# Patient Record
Sex: Male | Born: 1985 | Race: White | Hispanic: No | Marital: Married | Smoking: Never smoker
Health system: Southern US, Community
[De-identification: ages and names within clinical notes are randomized; demographics above are authoritative.]

## PROBLEM LIST (undated history)

## (undated) HISTORY — PX: APPENDECTOMY: SHX54

---
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
## 2008-09-10 ENCOUNTER — Observation Stay (HOSPITAL_COMMUNITY): Admission: EM | Admit: 2008-09-10 | Discharge: 2008-09-10 | Payer: Self-pay | Admitting: Emergency Medicine

## 2008-09-10 ENCOUNTER — Encounter (INDEPENDENT_AMBULATORY_CARE_PROVIDER_SITE_OTHER): Payer: Self-pay | Admitting: *Deleted

## 2010-11-30 LAB — URINALYSIS, ROUTINE W REFLEX MICROSCOPIC
Glucose, UA: NEGATIVE mg/dL
Ketones, ur: 15 mg/dL — AB
Leukocytes, UA: NEGATIVE
Nitrite: NEGATIVE
Protein, ur: NEGATIVE mg/dL

## 2010-11-30 LAB — DIFFERENTIAL
Basophils Absolute: 0 10*3/uL (ref 0.0–0.1)
Lymphocytes Relative: 5 % — ABNORMAL LOW (ref 12–46)
Neutro Abs: 13.3 10*3/uL — ABNORMAL HIGH (ref 1.7–7.7)
Neutrophils Relative %: 89 % — ABNORMAL HIGH (ref 43–77)

## 2010-11-30 LAB — COMPREHENSIVE METABOLIC PANEL
Albumin: 4 g/dL (ref 3.5–5.2)
BUN: 11 mg/dL (ref 6–23)
CO2: 25 mEq/L (ref 19–32)
Chloride: 103 mEq/L (ref 96–112)
Creatinine, Ser: 0.99 mg/dL (ref 0.4–1.5)
GFR calc non Af Amer: >60 mL/min (ref >60)
Glucose, Bld: 115 mg/dL — ABNORMAL HIGH (ref 70–99)
Total Bilirubin: 1 mg/dL (ref 0.3–1.2)

## 2010-11-30 LAB — LIPASE, BLOOD: Lipase: 27 U/L (ref 11–59)

## 2010-11-30 LAB — CBC
HCT: 37.9 % — ABNORMAL LOW (ref 39.0–52.0)
MCV: 86.7 fL (ref 78.0–100.0)
Platelets: 190 10*3/uL (ref 150–400)
WBC: 14.9 10*3/uL — ABNORMAL HIGH (ref 4.0–10.5)

## 2010-12-29 NOTE — Op Note (Signed)
Brian Blake, Brian Blake                ACCOUNT NO.:  [id number]   MEDICAL RECORD NO.:  [id number]          PATIENT TYPE:  INP   LOCATION:  5501                         FACILITY:  [facility]   PHYSICIAN:  Alfonse Ras, MD   DATE OF BIRTH:  06/06/1986   DATE OF PROCEDURE:  09/10/2008  DATE OF DISCHARGE:                               OPERATIVE REPORT   PREOPERATIVE DIAGNOSIS:  Acute appendicitis.   POSTOPERATIVE DIAGNOSIS:  Acute appendicitis.   PROCEDURE:  Laparoscopic appendectomy.   SURGEON:  Alfonse Ras, MD   ASSISTANT:  Letha Cape, PA   ANESTHESIA:  General.   DESCRIPTION OF PROCEDURE:  The patient was taken to the operating room  after informed consent was granted and placed in the supine position.  Foley catheter was placed.  Abdomen was prepped and draped in a normal  sterile fashion.  Using a 12-mm OptiVu trocar in the left upper  quadrant, peritoneal access was obtained under direct vision.  Pneumoperitoneum was obtained.  Additional 12-mm trocar was placed under  direct vision in the left lower quadrant and 5-mm trocar was placed in a  similar fashion.  The appendix which was somewhat inflamed but  suppurative with no evidence of perforation was mobilized anteriorly.  The mesoappendix was taken down with a harmonic scalpel.  The junction  of the base of the appendix and the cecum were identified and were  transected using a white load 45-mm GIA stapling device.  Staple line  was intact.  Adequate hemostasis was ensured.  Right lower quadrant was  copiously irrigated.  The appendix was placed in EndoCatch bag and  removed through the left lower quadrant port.  The fascial defect tear  was closed with a figure-of-eight 0-Vicryl suture.  Skin incisions were  closed with subcuticular 4-0 Monocryl.  Incisions were injected with 0.5  Marcaine.  The patient tolerated the procedure well.  Sterile dressings  were applied and he was taken to the PACU in good  condition.      Alfonse Ras, MD  Electronically Signed     KRE/MEDQ  D:  09/10/2008  T:  09/10/2008  Job:  [id number]

## 2011-07-30 ENCOUNTER — Ambulatory Visit (INDEPENDENT_AMBULATORY_CARE_PROVIDER_SITE_OTHER): Payer: 59

## 2011-07-30 DIAGNOSIS — J019 Acute sinusitis, unspecified: Secondary | ICD-10-CM

## 2011-07-30 DIAGNOSIS — R059 Cough, unspecified: Secondary | ICD-10-CM

## 2011-07-30 DIAGNOSIS — R05 Cough: Secondary | ICD-10-CM

## 2011-08-03 ENCOUNTER — Ambulatory Visit: Payer: Worker's Compensation

## 2011-08-09 ENCOUNTER — Ambulatory Visit: Payer: Worker's Compensation

## 2011-11-10 ENCOUNTER — Telehealth: Payer: Self-pay

## 2011-12-02 NOTE — Telephone Encounter (Signed)
test

## 2011-12-03 ENCOUNTER — Ambulatory Visit (INDEPENDENT_AMBULATORY_CARE_PROVIDER_SITE_OTHER): Payer: 59 | Admitting: Emergency Medicine

## 2011-12-03 ENCOUNTER — Ambulatory Visit: Payer: 59

## 2011-12-03 VITALS — BP 126/68 | HR 56 | Temp 97.7°F | Resp 18 | Ht 70.0 in | Wt 148.4 lb

## 2011-12-03 DIAGNOSIS — S81009A Unspecified open wound, unspecified knee, initial encounter: Secondary | ICD-10-CM

## 2011-12-03 DIAGNOSIS — T148XXA Other injury of unspecified body region, initial encounter: Secondary | ICD-10-CM

## 2011-12-03 DIAGNOSIS — L03119 Cellulitis of unspecified part of limb: Secondary | ICD-10-CM

## 2011-12-03 DIAGNOSIS — L089 Local infection of the skin and subcutaneous tissue, unspecified: Secondary | ICD-10-CM

## 2011-12-03 DIAGNOSIS — L02419 Cutaneous abscess of limb, unspecified: Secondary | ICD-10-CM

## 2011-12-03 DIAGNOSIS — S81809A Unspecified open wound, unspecified lower leg, initial encounter: Secondary | ICD-10-CM

## 2011-12-03 MED ORDER — CEPHALEXIN 500 MG PO CAPS: 500.0000 mg | ORAL_CAPSULE | Freq: Three times a day (TID) | ORAL | Status: AC

## 2011-12-03 NOTE — Progress Notes (Signed)
   Patient ID: Brian Blake MRN: [id number], DOB: 1985-12-16 26 y.o. Date of Encounter: 12/03/2011, 9:11 AM  Primary Physician: No primary provider on file.  Chief Complaint: Suture removal, placed at [facility]     HPI: 26 y.o. y/o male with injury to medial aspect of right knee.  Here for suture removal s/p placement on 11/20/11.  Patient was camping with some friends on the weekend of 11/20/11 and was prepping for a campfire one morning. As he was chopping the wood the hatchet passed through the wood into the medial right knee causing a deep laceration. He did have a first aid kit with him, so he dressed the wound and sought care at [facility]. His wound was closed at the local hospital and he was given a tetanus vaccine on 11/20/11. Since that time he has noticed up and down swelling coupled with localized erythema of the right knee, although this is improved from several days prior. He does have flexion, and extension without pain. He does state that the knee feels tight with motion. He has remained afebrile through out. No drainage or discharge from the wound. Sore with ambulation. He has been out of work due to this injury. He does complain of slightly decreased sensation along the lateral aspect of his knee.      No past medical history on file.   Home Meds: Prior to Admission medications   Not on File    Allergies: No Known Allergies  Physical Exam: Blood pressure 126/68, pulse 56, temperature 97.7 F (36.5 C), temperature source Oral, resp. rate 18, height 5\' 10"  (1.778 m), weight 148 lb 6.4 oz (67.314 kg)., Body mass index is 21.29 kg/(m^2). General: Well developed, well nourished, in no acute distress. Head: Normocephalic, atraumatic, sclera non-icteric, no xanthomas, nares are without discharge.  Neck: Supple. Lungs: Breathing is unlabored. Heart: Normal rate. Msk:  Strength and tone appear normal for age. Wound: Right knee with 4 inch vertical  laceration along medial aspect with some localized erythema. Effusion present along right knee. Flexion and extension intact without pain, only complains of tightness.Mild tenderness to palpation through out the knee. FROM and 5/5 strength. Sensation along lateral aspect of knee slightly decreased. Skin: See above, otherwise dry without rash or erythema. Extremities: No clubbing or cyanosis. No edema. Neuro: Alert and oriented X 3. Moves all extremities spontaneously.  Psych:  Responds to questions appropriately with a normal affect.   PROCEDURE: Verbal consent obtained. 8 sutures removed without difficulty.  [facility] reading (PRIMARY) by  Dr. Cleta Alberts. Negative   Assessment and Plan: 26 y.o. y/o male here for suture removal for wound described above with superficial wound infection -Sutures removed per above -Keflex 500 mg #30 1 po tid no RF -Recheck 72 hours, sooner if worse -Light duty secondary to ensuring his safety at work -We have contacted [facility] to fax his records -Tetanus was given at [facility] at time of wound closure   Signed, Eula Listen, PA-C 12/03/2011 9:11 AM

## 2011-12-06 ENCOUNTER — Ambulatory Visit (INDEPENDENT_AMBULATORY_CARE_PROVIDER_SITE_OTHER): Payer: 59 | Admitting: Family Medicine

## 2011-12-06 VITALS — BP 137/77 | HR 50 | Temp 98.6°F | Resp 12 | Ht 70.0 in | Wt 147.2 lb

## 2011-12-06 DIAGNOSIS — M25469 Effusion, unspecified knee: Secondary | ICD-10-CM

## 2011-12-06 DIAGNOSIS — S81809A Unspecified open wound, unspecified lower leg, initial encounter: Secondary | ICD-10-CM

## 2011-12-06 DIAGNOSIS — S81009A Unspecified open wound, unspecified knee, initial encounter: Secondary | ICD-10-CM

## 2011-12-06 MED ORDER — OXAPROZIN 600 MG PO TABS: 600.0000 mg | ORAL_TABLET | Freq: Two times a day (BID) | ORAL | Status: AC

## 2011-12-06 NOTE — Progress Notes (Signed)
Subjective: Patient is here for followup with regard to his knee injury. He cut it with a hatchet and got sutures elsewhere. He came in here a few days ago had the sutures removed. He is asked to come back in for followup with regard to the leg wound. He is doing much better. He can walk well. The knee is still a little bit swollen. He thinks he can return to his job duties. He does have a little difficulty squatting weight down. 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 He feels like he can move well enough that he can do whatever he would need to do in this setting he is in.  Objective Right knee has good range of motion. No weakness of the ligaments is noted. Nasal he has a small to moderate effusion. No erythema.  Assessment right knee wound, significantly improved right knee effusion  Plan filled out forms for his FMLA. I do not think he needs to be off further at this point, but the potential remains it could get worse and have to be off of work. I tried to explain all this to him. Patient had understood he would not have to pay today, but I explained that this was not in a global window type pay.

## 2011-12-06 NOTE — Patient Instructions (Signed)
Take the anti-inflammatory medicine for the knee. If the swelling gets worse or you are developing more pain or redness return for one more recheck.

## 2012-10-04 ENCOUNTER — Ambulatory Visit (INDEPENDENT_AMBULATORY_CARE_PROVIDER_SITE_OTHER): Payer: BC Managed Care – PPO | Admitting: Family Medicine

## 2012-10-04 VITALS — BP 112/76 | HR 83 | Temp 100.1°F | Resp 18 | Ht 70.25 in | Wt 156.6 lb

## 2012-10-04 DIAGNOSIS — J029 Acute pharyngitis, unspecified: Secondary | ICD-10-CM

## 2012-10-04 DIAGNOSIS — R059 Cough, unspecified: Secondary | ICD-10-CM

## 2012-10-04 DIAGNOSIS — R509 Fever, unspecified: Secondary | ICD-10-CM

## 2012-10-04 LAB — POCT INFLUENZA A/B
Influenza A, POC: NEGATIVE
Influenza B, POC: NEGATIVE

## 2012-10-04 LAB — POCT RAPID STREP A (OFFICE): Rapid Strep A Screen: NEGATIVE

## 2012-10-04 MED ORDER — CEFDINIR 300 MG PO CAPS: 300.0000 mg | ORAL_CAPSULE | Freq: Two times a day (BID) | ORAL | Status: DC

## 2012-10-04 NOTE — Patient Instructions (Addendum)
Use the antibiotic as directed.  Drink plenty of fluids, and use ibuprofen/ tylenol as needed for pain and fever.  Let us know if you are not better in the next 1 or 2 days- if you start getting worse please come back in.

## 2012-10-04 NOTE — Progress Notes (Signed)
[facility] [address], [place] [address] [phone]  Date:  10/04/2012   Name:  Brian Blake   DOB:  22-Jun-1986   MRN:  [id number]  PCP:  No primary provider on file.    Chief Complaint: Generalized Body Aches, Cough, Fever, Chills and Sore Throat   History of Present Illness:  Brian Blake is a 27 y.o. very pleasant male patient who presents with the following:  Here today with illnes- he noted a slight tickle in his throat yesterday.  Last night he noted increasing symptoms of chills, aches, increasing ST/ tender glands and cough (cough is mild) He felt even worse today, cough is generally non- productive but he is getting up a small amount of mucus He had not checked his temperature at home, was not aware of a fever  He has felt "uneasy" in his stomach but has not had any vomitng or diarrhea, he is eating ok.  He has been taking antipyretics, but nothing yet this am  There is no problem list on file for this patient.   History reviewed. No pertinent past medical history.  Past Surgical History  Procedure Laterality Date  . Appendectomy      History  Substance Use Topics  . Smoking status: Never Smoker   . Smokeless tobacco: Not on file  . Alcohol Use: Not on file    Family History  Problem Relation Age of Onset  . Heart disease Father   . Heart disease Paternal Grandfather     pacemaker  . Diabetes Paternal Grandfather     No Known Allergies  Medication list has been reviewed and updated.  Current Outpatient Prescriptions on File Prior to Visit  Medication Sig Dispense Refill  . oxaprozin (DAYPRO) 600 MG tablet Take 1 tablet (600 mg total) by mouth 2 (two) times daily.  30 tablet  1   No current facility-administered medications on file prior to visit.    Review of Systems:  As per HPI- otherwise negative. He is generally very healthy  Physical Examination: Filed Vitals:   10/04/12 0828  BP: 112/76  Pulse: 83   Temp: 100.1 F (37.8 C)  Resp: 18   Filed Vitals:   10/04/12 0828  Height: 5' 10.25" (1.784 m)  Weight: 156 lb 9.6 oz (71.033 kg)   Body mass index is 22.32 kg/(m^2). Ideal Body Weight: Weight in (lb) to have BMI = 25: 175.1  GEN: WDWN, NAD, Non-toxic, A & O x 3, looks well HEENT: Atraumatic, Normocephalic. Neck supple. No masses, No LAD.  Bilateral TM wnl, oropharynx normal.  PEERL,EOMI.   Ears and Nose: No external deformity. CV: RRR, No M/G/R. No JVD. No thrill. No extra heart sounds. PULM: CTA B, no wheezes, crackles, rhonchi. No retractions. No resp. distress. No accessory muscle use. ABD: S, NT, ND, +BS. No rebound. No HSM. EXTR: No c/c/e NEURO Normal gait.  PSYCH: Normally interactive. Conversant. Not depressed or anxious appearing.  Calm demeanor.   Results for orders placed in visit on 10/04/12  POCT INFLUENZA A/B      Result Value Range   Influenza A, POC Negative     Influenza B, POC Negative    POCT RAPID STREP A (OFFICE)      Result Value Range   Rapid Strep A Screen Negative  Negative    Assessment and Plan: Fever, unspecified - Plan: POCT Influenza A/B, cefdinir (OMNICEF) 300 MG capsule  Cough  Acute pharyngitis - Plan:  POCT rapid strep A, cefdinir (OMNICEF) 300 MG capsule  Treat with omnicef for pharyngitis.  Close follow-up if not better- Sooner if worse.     Abbe Amsterdam, MD

## 2012-10-17 ENCOUNTER — Telehealth: Payer: Self-pay

## 2012-10-17 NOTE — Telephone Encounter (Signed)
PT STATES HE WAS PUT ON ANTIBIOTICS AND TOLD BY DR COPLAND TO CALL IF HE HAD QUESTIONS. HE HAVE FINISHED THE MEDICINE AND ISN'T GETTING ANY BETTER PLEASE CALL [phone]    [facility] [address]

## 2012-10-17 NOTE — Telephone Encounter (Signed)
LMOM to CB. 

## 2012-10-18 NOTE — Telephone Encounter (Signed)
Agree, RTC if no improvement

## 2012-10-18 NOTE — Telephone Encounter (Signed)
Patient states he did finish the [place]. He does have sore throat still in the mornings. He states he is not fatigued does still have cough. He is advised to try Mucinex DM and use lots of fluids if cough persists beyond next couple days he will return to clinic for additional labs/ Amy

## 2013-02-06 ENCOUNTER — Ambulatory Visit: Payer: BC Managed Care – PPO

## 2013-02-06 ENCOUNTER — Ambulatory Visit (INDEPENDENT_AMBULATORY_CARE_PROVIDER_SITE_OTHER): Payer: BC Managed Care – PPO | Admitting: Family Medicine

## 2013-02-06 VITALS — BP 114/78 | HR 63 | Temp 97.9°F | Resp 16 | Ht 69.5 in | Wt 154.2 lb

## 2013-02-06 DIAGNOSIS — R05 Cough: Secondary | ICD-10-CM

## 2013-02-06 DIAGNOSIS — A630 Anogenital (venereal) warts: Secondary | ICD-10-CM

## 2013-02-06 DIAGNOSIS — R059 Cough, unspecified: Secondary | ICD-10-CM

## 2013-02-06 MED ORDER — AZITHROMYCIN 250 MG PO TABS: ORAL_TABLET | ORAL | Status: DC

## 2013-02-06 NOTE — Patient Instructions (Addendum)
We are going to treat you for bronchitis with azitromycin ("zpack").  Let me know if you are not better in the next few days- Sooner if worse.    Review the information about genital warts, and discuss with your finance.  If she has not yet had gardasil, she should consider having this vaccine series.  You can also have this vaccine series if you would like.   When you return from your trip we can treat your warts with freezing or another treatment if you life.  Condoms will help to prevent transmission

## 2013-02-06 NOTE — Progress Notes (Signed)
[facility] [address] [place] [address] [phone]  Date:  02/06/2013   Name:  Brian Blake   DOB:  01-Aug-1986   MRN:  [id number]  PCP:  No primary provider on file.    Chief Complaint: Cough, Nasal Congestion and skin tags   History of Present Illness:  Brian Blake is a 27 y.o. very pleasant male patient who presents with the following:  Generally healthy.   He is here today with URI symptoms for the last couple of days.  He has been coughing up some mucus, and then developed a worse cough and "rattling" in his chest over the last couple of days.  He also feels tired, has noted body aches.  He has not checked his temperature, and has not noted chills. He has taken mucinex.  He did not take any antipyretics this morning He does note a stuffy nose and nasal discharge.    He is getting married in about 2 weeks and will travel to [place] and the [place] on his honeymoon.   He has also noted "some skin tags" in his genital area.  A couple of years ago he brought some "bumps" to the attention of his doctor.  The doctor told him that he had some changes from shaving and it was nothing to worry about. He does not have any pains, vesicles or sores.  However, he notes that he is getting more skin tags and is concerned about what they might mean.  He has not yet been SA with his wife to be, but has been SA in the past "when I was young and stupid."    There are no active problems to display for this patient.   History reviewed. No pertinent past medical history.  Past Surgical History  Procedure Laterality Date  . Appendectomy      History  Substance Use Topics  . Smoking status: Never Smoker   . Smokeless tobacco: Not on file  . Alcohol Use: Yes    Family History  Problem Relation Age of Onset  . Heart disease Father   . Heart disease Paternal Grandfather     pacemaker  . Diabetes Paternal Grandfather     No Known Allergies  Medication  list has been reviewed and updated.  Current Outpatient Prescriptions on File Prior to Visit  Medication Sig Dispense Refill  . cefdinir (OMNICEF) 300 MG capsule Take 1 capsule (300 mg total) by mouth 2 (two) times daily.  20 capsule  0   No current facility-administered medications on file prior to visit.    Review of Systems:  As per HPI- otherwise negative.   Physical Examination: Filed Vitals:   02/06/13 0959  BP: 114/78  Pulse: 63  Temp: 97.9 F (36.6 C)  Resp: 16   Filed Vitals:   02/06/13 0959  Height: 5' 9.5" (1.765 m)  Weight: 154 lb 3.2 oz (69.945 kg)   Body mass index is 22.45 kg/(m^2). Ideal Body Weight: Weight in (lb) to have BMI = 25: 171.4  GEN: WDWN, NAD, Non-toxic, A & O x 3, looks well HEENT: Atraumatic, Normocephalic. Neck supple. No masses, No LAD.  Bilateral TM wnl, oropharynx normal.  PEERL,EOMI.   Ears and Nose: No external deformity. CV: RRR, No M/G/R. No JVD. No thrill. No extra heart sounds. PULM: CTA B, no wheezes, crackles. No retractions. No resp. distress. No accessory muscle use.  Minimal ronchi right lung. ABD: S, NT, ND, +BS.  No rebound. No HSM. EXTR: No c/c/e NEURO Normal gait.  PSYCH: Normally interactive. Conversant. Not depressed or anxious appearing.  Calm demeanor.  GU: there are small, scattered genital warts in his suprapubic area.  No penile discharge or scrotal tenderness or masses  [facility] reading (PRIMARY) by  Dr. Patsy Lager. CXR: minimal infiltrate retrocardiac space.   Discussed with pt- possible minimal infiltrate CHEST - 2 VIEW  Comparison: None available.  Findings: The heart size is normal. The lungs are clear. No focal airspace disease is evident. The visualized soft tissues and bony thorax are unremarkable. The  IMPRESSION: Negative two-view chest x-ray.  Clinically significant discrepancy from primary report, if provided: The preliminary report suggested a retrocardiac infiltrate. No significant disease is  evident. There are some vascular markings, within normal limits.  These results will be called to the ordering clinician or representative by the Radiologist Assistant, and communication documented in the PACS Dashboard.   Assessment and Plan: Cough - Plan: DG Chest 2 View, azithromycin (ZITHROMAX Z-PAK) 250 MG tablet  Warts, genital  Bronchitis- treat with zpack.  He will let me know if not better in the next few days Genital warts: given handout.  Discussed cryotherapy.  However, as he is getting married in 2 weeks decided to defer this tx so he will not develop any soreness.  Discussed contagion of the HPV virus.  He thinks his finance has had Gardasil.  Encouraged him to discuss this with her.  He can also have gardasil if he likes, but it will not treat the warts he already has.    Signed Abbe Amsterdam, MD

## 2013-03-12 ENCOUNTER — Ambulatory Visit (INDEPENDENT_AMBULATORY_CARE_PROVIDER_SITE_OTHER): Payer: BC Managed Care – PPO | Admitting: Family Medicine

## 2013-03-12 VITALS — BP 122/78 | HR 58 | Temp 98.6°F | Resp 18 | Ht 70.0 in | Wt 154.8 lb

## 2013-03-12 DIAGNOSIS — A630 Anogenital (venereal) warts: Secondary | ICD-10-CM

## 2013-03-12 DIAGNOSIS — B079 Viral wart, unspecified: Secondary | ICD-10-CM

## 2013-03-12 NOTE — Patient Instructions (Addendum)
Please come and see me in 3 weeks or so and we will freeze again.

## 2013-03-12 NOTE — Progress Notes (Signed)
[facility] [address] [place] [address] [phone]  Date:  03/12/2013   Name:  Brian Blake   DOB:  Mar 16, 1986   MRN:  [id number]  PCP:  No PCP Per Patient    Chief Complaint: No chief complaint on file.   History of Present Illness:  Brian Blake is a 27 y.o. very pleasant male patient who presents with the following:  He is here today to treat genital warts.  He was here last month and was noted to have some warts on his penis, but we deferred treatment as he was getting married and going on his honeymoon.  He is now back and would like cryotherapy.  He is otherwise healthy.  These warts have been present for some time, no other symptoms.  He is SA only with his wife.    There are no active problems to display for this patient.   History reviewed. No pertinent past medical history.  Past Surgical History  Procedure Laterality Date  . Appendectomy      History  Substance Use Topics  . Smoking status: Never Smoker   . Smokeless tobacco: Not on file  . Alcohol Use: Yes    Family History  Problem Relation Age of Onset  . Heart disease Father   . Heart disease Paternal Grandfather     pacemaker  . Diabetes Paternal Grandfather     No Known Allergies  Medication list has been reviewed and updated.  Current Outpatient Prescriptions on File Prior to Visit  Medication Sig Dispense Refill  . azithromycin (ZITHROMAX Z-PAK) 250 MG tablet Use as a zpack  6 each  0  . dextromethorphan-guaiFENesin (MUCINEX DM) 30-600 MG per 12 hr tablet Take 1 tablet by mouth every 12 (twelve) hours.       No current facility-administered medications on file prior to visit.    Review of Systems:  As per HPI- otherwise negative.   Physical Examination: Filed Vitals:   03/12/13 1415  BP: 122/78  Pulse: 58  Temp: 98.6 F (37 C)  Resp: 18   Filed Vitals:   03/12/13 1415  Height: 5\' 10"  (1.778 m)  Weight: 154 lb 12.8 oz (70.217 kg)   Body mass  index is 22.21 kg/(m^2). Ideal Body Weight: Weight in (lb) to have BMI = 25: 173.9   GEN: WDWN, NAD, Non-toxic, Alert & Oriented x 3 HEENT: Atraumatic, Normocephalic.  Ears and Nose: No external deformity. EXTR: No clubbing/cyanosis/edema NEURO: Normal gait.  PSYCH: Normally interactive. Conversant. Not depressed or anxious appearing.  Calm demeanor.  Genital area: he has approx 10 small genital wars on his suprapubic area and on the base of the penis and scrotum.  No other lesions or vesicles  VC obtained.  Treated all warts with cryotherapy X3.  Pt tolerated well  Assessment and Plan: Warts, genital  Treated with cryotherapy.  Please come and see [place] in about 3 weeks for repeat treatment as needed.      Signed Abbe Amsterdam, MD

## 2013-03-27 ENCOUNTER — Encounter: Payer: Self-pay | Admitting: Family Medicine

## 2013-07-28 ENCOUNTER — Ambulatory Visit (INDEPENDENT_AMBULATORY_CARE_PROVIDER_SITE_OTHER): Payer: BC Managed Care – PPO | Admitting: Family Medicine

## 2013-07-28 VITALS — BP 126/78 | HR 53 | Temp 98.2°F | Resp 16 | Ht 70.0 in | Wt 152.6 lb

## 2013-07-28 DIAGNOSIS — R05 Cough: Secondary | ICD-10-CM

## 2013-07-28 DIAGNOSIS — B977 Papillomavirus as the cause of diseases classified elsewhere: Secondary | ICD-10-CM

## 2013-07-28 DIAGNOSIS — R059 Cough, unspecified: Secondary | ICD-10-CM

## 2013-07-28 DIAGNOSIS — J209 Acute bronchitis, unspecified: Secondary | ICD-10-CM

## 2013-07-28 MED ORDER — IMIQUIMOD 5 % EX CREA: TOPICAL_CREAM | CUTANEOUS | Status: DC

## 2013-07-28 MED ORDER — HYDROCOD POLST-CHLORPHEN POLST 10-8 MG/5ML PO LQCR: 5.0000 mL | Freq: Two times a day (BID) | ORAL | Status: DC | PRN

## 2013-07-28 MED ORDER — BENZONATATE 100 MG PO CAPS: 100.0000 mg | ORAL_CAPSULE | Freq: Three times a day (TID) | ORAL | Status: DC | PRN

## 2013-07-28 MED ORDER — AZITHROMYCIN 250 MG PO TABS: ORAL_TABLET | ORAL | Status: DC

## 2013-07-28 NOTE — Progress Notes (Addendum)
Subjective:   This chart was scribed for [name] MD, by [name], ED Scribe. This patient's care was started at 10:11 AM.    Patient ID: Brian Blake, male    DOB: [birth year], 27 y.o.   MRN: [id number] Chief Complaint  Patient presents with  . Cough    x 1 week   . Genital Warts    has had for a period of time     HPI  Brian Blake is a 27 y.o. male who presents to [facility] with his wife complaining of a gradually-worsening cough which began approximately a week ago. He reports he began coughing the day after he had strained his voice while playing a prank. He has treated the symptoms with Mucinex; he has taken 2 doses a day for the past 6 days w/o relief. However, the cough has continued and remains productive of phlegm. The cough stays the same throughout the day, not worsening at night. In the mornings, the pt has also experienced a sore throat which resolves during the day.  No h/o GERD or asthma. His wife has also had some congestion recently but hers resolved. The pt states he has a h/o similar symptoms once a year, and it is improved with a Z-pack or amoxacillin.   The pt also complained of genital warts which were treated via cryotherapy by Dr. Dallas Schimke six weeks ago.  He states that the cyrotherapy did not improve his symptoms at all - he saw no change in the lesions after. Her has never tried any other treatment. He has not had a follow-up until today due to school and work commitments. His wife has not experienced any lesions and she has received all 3 doses of the HPV vaccine. Active Ambulatory Problems    Diagnosis Date Noted  . No Active Ambulatory Problems   Resolved Ambulatory Problems    Diagnosis Date Noted  . No Resolved Ambulatory Problems   No Additional Past Medical History   Current Outpatient Prescriptions on File Prior to Visit  Medication Sig Dispense Refill  . azithromycin (ZITHROMAX Z-PAK) 250 MG tablet Use as a zpack  6 each  0  .  dextromethorphan-guaiFENesin (MUCINEX DM) 30-600 MG per 12 hr tablet Take 1 tablet by mouth every 12 (twelve) hours.       No current facility-administered medications on file prior to visit.   No Known Allergies   Review of Systems  Constitutional: Negative for fever and chills.  HENT: Positive for sore throat. Negative for congestion and sinus pressure.   Respiratory: Positive for cough. Negative for chest tightness, shortness of breath and wheezing.   Cardiovascular: Negative for chest pain, palpitations and leg swelling.  Genitourinary: Positive for genital sores.  Musculoskeletal: Negative for myalgias.     BP 126/78  Pulse 53  Temp(Src) 98.2 F (36.8 C) (Oral)  Resp 16  Ht 5\' 10"  (1.778 m)  Wt 152 lb 9.6 oz (69.219 kg)  BMI 21.90 kg/m2  SpO2 99%    Objective:   Physical Exam  Nursing note and vitals reviewed. Constitutional: He is oriented to person, place, and time. He appears well-developed and well-nourished. No distress.  HENT:  Head: Normocephalic and atraumatic.  Right Ear: A middle ear effusion is present.  Left Ear: A middle ear effusion is present.  Nose: Nose normal. No mucosal edema or rhinorrhea.  Mouth/Throat: Uvula is midline, oropharynx is clear and moist and mucous membranes are normal. No oropharyngeal exudate, posterior oropharyngeal edema  or posterior oropharyngeal erythema.  Eyes: EOM are normal.  Neck: Neck supple. No tracheal deviation present.  Cardiovascular: Normal rate, regular rhythm and intact distal pulses.   No murmur heard. Pulmonary/Chest: Effort normal and breath sounds normal. No respiratory distress. He has no wheezes. He has no rales. He exhibits no tenderness.  Genitourinary: Right testis shows no mass, no swelling and no tenderness. Left testis shows no mass, no swelling and no tenderness. Circumcised. No penile erythema or penile tenderness. No discharge found.  Approx 15 small pinhead sized 2-82mm flesh colored papules, some w/  central umbilicus spread over pubic bone, base of penis and upper anterior scrotum.  Musculoskeletal: Normal range of motion.  Lymphadenopathy:       Head (right side): No submental, no submandibular, no preauricular and no posterior auricular adenopathy present.       Head (left side): No submental, no submandibular, no preauricular and no posterior auricular adenopathy present.    He has no cervical adenopathy.       Right: No inguinal adenopathy present.       Left: No inguinal adenopathy present.  Neurological: He is alert and oriented to person, place, and time.  Skin: Skin is warm and dry.  Psychiatric: He has a normal mood and affect. His behavior is normal.   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 Wife and assistant were present during procedure.    Assessment & Plan:  Acute bronchitis - Plan: azithromycin (ZITHROMAX Z-PAK) 250 MG table - Gave snap rx for zpack in case sxs worsen but suspect viral bronchitis at this point.  Advised him to use the prescribed cough syrup at night since it may make him drowsy and tessalon pearls during the day. Informed the pt that he may continue to have similar symptoms for the next 2 weeks. However, can fill zpack if his symptoms worsen w/ f/c/purulent sputum.     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 Also encouraged the pt to get plenty of rest, exercise, and choose other healthy life-style options such as a multi-vitamin - poss w/ folate, and to avoid passive smoke exposure.  Meds ordered this encounter  Medications  . azithromycin (ZITHROMAX Z-PAK) 250 MG tablet    Sig: Use as a zpack    Dispense:  6  each    Refill:  0  . benzonatate (TESSALON) 100 MG capsule    Sig: Take 1-2 capsules (100-200 mg total) by mouth 3 (three) times daily as needed for cough.    Dispense:  40 capsule    Refill:  0  . DISCONTD: imiquimod (ALDARA) 5 % cream    Sig: Apply topically 3 (three) times a week.    Dispense:  12 each    Refill:  0  . chlorpheniramine-HYDROcodone (TUSSIONEX PENNKINETIC ER) 10-8 MG/5ML LQCR    Sig: Take 5 mLs by mouth every 12 (twelve) hours as needed for cough (cough).    Dispense:  140 mL    Refill:  0  . imiquimod (ALDARA) 5 % cream    Sig: Apply topically 3 (three) times a week.    Dispense:  12 each    Refill:  5    I personally performed the services described in this documentation, which was scribed in my presence. The recorded information has been reviewed and considered, and addended by me as needed.  [name], MD MPH

## 2013-07-28 NOTE — Patient Instructions (Signed)
Acute Bronchitis Bronchitis is inflammation of the airways that extend from the windpipe into the lungs (bronchi). The inflammation often causes mucus to develop. This leads to a cough, which is the most common symptom of bronchitis.  In acute bronchitis, the condition usually develops suddenly and goes away over time, usually in a couple weeks. Smoking, allergies, and asthma can make bronchitis worse. Repeated episodes of bronchitis may cause further lung problems.  CAUSES Acute bronchitis is most often caused by the same virus that causes a cold. The virus can spread from person to person (contagious).  SIGNS AND SYMPTOMS   Cough.   Fever.   Coughing up mucus.   Body aches.   Chest congestion.   Chills.   Shortness of breath.   Sore throat.  DIAGNOSIS  Acute bronchitis is usually diagnosed through a physical exam. Tests, such as chest X-rays, are sometimes done to rule out other conditions.  TREATMENT  Acute bronchitis usually goes away in a couple weeks. Often times, no medical treatment is necessary. Medicines are sometimes given for relief of fever or cough. Antibiotics are usually not needed but may be prescribed in certain situations. In some cases, an inhaler may be recommended to help reduce shortness of breath and control the cough. A cool mist vaporizer may also be used to help thin bronchial secretions and make it easier to clear the chest.  HOME CARE INSTRUCTIONS  Get plenty of rest.   Drink enough fluids to keep your urine clear or pale yellow (unless you have a medical condition that requires fluid restriction). Increasing fluids may help thin your secretions and will prevent dehydration.   Only take over-the-counter or prescription medicines as directed by your health care provider.   Avoid smoking and secondhand smoke. Exposure to cigarette smoke or irritating chemicals will make bronchitis worse. If you are a smoker, consider using nicotine gum or skin  patches to help control withdrawal symptoms. Quitting smoking will help your lungs heal faster.   Reduce the chances of another bout of acute bronchitis by washing your hands frequently, avoiding people with cold symptoms, and trying not to touch your hands to your mouth, nose, or eyes.   Follow up with your health care provider as directed.  SEEK MEDICAL CARE IF: Your symptoms do not improve after 1 week of treatment.  SEEK IMMEDIATE MEDICAL CARE IF:  You develop an increased fever or chills.   You have chest pain.   You have severe shortness of breath.  You have bloody sputum.   You develop dehydration.  You develop fainting.  You develop repeated vomiting.  You develop a severe headache. MAKE SURE YOU:   Understand these instructions.  Will watch your condition.  Will get help right away if you are not doing well or get worse. Document Released: 09/09/2004 Document Revised: 04/04/2013 Document Reviewed: 01/23/2013 [facility] Patient Information 2014 [place], [place]. Genital Warts Genital warts are a sexually transmitted infection. They may appear as small bumps on the tissues of the genital area. CAUSES  Genital warts are caused by a virus called human papillomavirus (HPV). HPV is the most common sexually transmitted disease (STD) and infection of the sex organs. This infection is spread by having unprotected sex with an infected person. It can be spread by vaginal, anal, and oral sex. Many people do not know they are infected. They may be infected for years without problems. However, even if they do not have problems, they can unknowingly pass the infection to their  sexual partners. SYMPTOMS   Itching and irritation in the genital area.  Warts that bleed.  Painful sexual intercourse. DIAGNOSIS  Warts are usually recognized with the naked eye on the vagina, vulva, perineum, anus, and rectum. Certain tests can also diagnose genital warts, such as:  A Pap  test.  A tissue sample (biopsy) exam.  Colposcopy. A magnifying tool is used to examine the vagina and cervix. The HPV cells will change color when certain solutions are used. TREATMENT  Warts can be removed by:  Applying certain chemicals, such as cantharidin or podophyllin.  Liquid nitrogen freezing (cryotherapy).  Immunotherapy with candida or trichophyton injections.  Laser treatment.  Burning with an electrified probe (electrocautery).  Interferon injections.  Surgery. PREVENTION  HPV vaccination can help prevent HPV infections that cause genital warts and that cause cancer of the cervix. It is recommended that the vaccination be given to people between the ages 67 to 51 years old. The vaccine might not work as well or might not work at all if you already have HPV. It should not be given to pregnant women. HOME CARE INSTRUCTIONS   It is important to follow your caregiver's instructions. The warts will not go away without treatment. Repeat treatments are often needed to get rid of warts. Even after it appears that the warts are gone, the normal tissue underneath often remains infected.  Do not try to treat genital warts with medicine used to treat hand warts. This type of medicine is strong and can burn the skin in the genital area, causing more damage.  Tell your past and current sexual partner(s) that you have genital warts. They may be infected also and need treatment.  Avoid sexual contact while being treated.  Do not touch or scratch the warts. The infection may spread to other parts of your body.  Women with genital warts should have a cervical cancer check (Pap test) at least once a year. This type of cancer is slow-growing and can be cured if found early. Chances of developing cervical cancer are increased with HPV.  Inform your obstetrician about your warts in the event of pregnancy. This virus can be passed to the baby's respiratory tract. Discuss this with your  caregiver.  Use a condom during sexual intercourse. Following treatment, the use of condoms will help prevent reinfection.  Ask your caregiver about using over-the-counter anti-itch creams. SEEK MEDICAL CARE IF:   Your treated skin becomes red, swollen, or painful.  You have a fever.  You feel generally ill.  You feel little lumps in and around your genital area.  You are bleeding or have painful sexual intercourse. MAKE SURE YOU:   Understand these instructions.  Will watch your condition.  Will get help right away if you are not doing well or get worse. Document Released: 07/30/2000 Document Revised: 10/25/2011 Document Reviewed: 02/08/2011 [facility] Patient Information 2014 [place], [place]. Imiquimod skin cream What is this medicine? IMIQUIMOD (i mi KWI mod) cream is used to treat external genital or anal warts. It is also used to treat other skin conditions such as actinic keratosis and certain types of skin cancer. This medicine may be used for other purposes; ask your health care provider or pharmacist if you have questions. COMMON BRAND NAME(S): Celesta Aver What should I tell my health care provider before I take this medicine? They need to know if you have any of these conditions: -decreased immune function -an unusual or allergic reaction to imiquimod, other medicines, foods, dyes, or  preservatives -pregnant or trying to get pregnant -breast-feeding How should I use this medicine? This medicine is for external use only. Do not take by mouth. Follow the directions on the prescription label. Apply just before bedtime. Wash your hands before and after use. Apply a thin layer of cream and massage gently into the affected areas until no longer visible. Do not use in the mouth, eyes or the vagina. Use this medicine only on the affected area as directed by your health care provider. Do not use for longer than prescribed. It is important not to use more medicine than  prescribed. To do so may increase the chance of side effects. Talk to your pediatrician regarding the use of this medicine in children. While this drug may be prescribed for children as young as 71 years of age for selected conditions, precautions do apply. Overdosage: If you think you have taken too much of this medicine contact a poison control center or emergency room at once. NOTE: This medicine is only for you. Do not share this medicine with others. What if I miss a dose? If you miss a dose, use it as soon as you can. If it is almost time for your next dose, use only that dose. Do not use double or extra doses. What may interact with this medicine? Interactions are not expected. Do not use any other medicines on the treated area without asking your doctor or health care professional. This list may not describe all possible interactions. Give your health care provider a list of all the medicines, herbs, non-prescription drugs, or dietary supplements you use. Also tell them if you smoke, drink alcohol, or use illegal drugs. Some items may interact with your medicine. What should I watch for while using this medicine? Visit your health care professional for regular checks on your progress. Do not use this medicine until the skin has healed from any other drug (example: podofilox or podophyllin resin) or surgical skin treatment. Females should receive regular pelvic exams while being treated for genital warts. Most patients see improvement within 4 weeks. It may take up to 16 weeks to see a full clearing of the warts. This medicine is not a cure. New warts may develop during or after treatment. Avoid sexual (genital, anal, oral) contact while the cream is on the skin. If warts are visible in the genital area, sexual contact should be avoided until the warts are treated. The use of latex condoms during sexual contact may reduce, but not entirely prevent, infecting others. This medicine may weaken  condoms, diaphragms, cervical caps or other barrier devices and make them less effective as birth control. Do not cover the treated area with an airtight bandage. Cotton gauze dressings can be used. Cotton underwear can be worn after using this medicine on the genital or anal area. Actinic keratoses that were not seen before may appear during treatment and may later go away. The treatment area and surrounding area may lighten or darken after treatment with this medicine. These skin color changes may be permanent in some patients. If you experience a skin reaction at the treatment site that interferes or prevents you from doing any daily activity, contact your health care provider. You may need a rest period from treatment. Treatment may be restarted once the reaction has gotten better as recommended by your doctor or health care professional. This medicine can make you more sensitive to the sun. Keep out of the sun. If you cannot avoid being in the  sun, wear protective clothing and use sunscreen. Do not use sun lamps or tanning beds/booths. What side effects may I notice from receiving this medicine? Side effects that you should report to your doctor or health care professional as soon as possible: -open sores with or without drainage -skin infection -skin rash -unusual or severe skin reaction Side effects that usually do not require medical attention (report to your doctor or health care professional if they continue or are bothersome): -burning or itching -redness of the skin (very common but is usually not painful or harmful) -scabbing, crusting, or peeling skin -skin that becomes hard or thickened -swelling of the skin This list may not describe all possible side effects. Call your doctor for medical advice about side effects. You may report side effects to FDA at 1-800-FDA-1088. Where should I keep my medicine? Keep out of the reach of children. Store between 4 and 25 degrees C (39 and 77  degrees F). Do not freeze. Throw away any unused medicine after the expiration date. Discard packet after applying to affected area. Partial packets should not be saved or reused. NOTE: This sheet is a summary. It may not cover all possible information. If you have questions about this medicine, talk to your doctor, pharmacist, or health care provider.  2014, [employer]vier/[employer] Standard. (2008-07-16 10:33:25)

## 2013-09-20 ENCOUNTER — Ambulatory Visit (INDEPENDENT_AMBULATORY_CARE_PROVIDER_SITE_OTHER): Payer: BC Managed Care – PPO | Admitting: Emergency Medicine

## 2013-09-20 ENCOUNTER — Ambulatory Visit: Payer: BC Managed Care – PPO

## 2013-09-20 ENCOUNTER — Ambulatory Visit: Payer: Self-pay

## 2013-09-20 VITALS — BP 110/78 | HR 88 | Temp 99.6°F | Resp 18 | Ht 71.25 in | Wt 150.8 lb

## 2013-09-20 DIAGNOSIS — R1013 Epigastric pain: Secondary | ICD-10-CM

## 2013-09-20 DIAGNOSIS — R112 Nausea with vomiting, unspecified: Secondary | ICD-10-CM

## 2013-09-20 LAB — COMPREHENSIVE METABOLIC PANEL
ALBUMIN: 4.7 g/dL (ref 3.5–5.2)
ALT: 25 U/L (ref 0–53)
AST: 25 U/L (ref 0–37)
Alkaline Phosphatase: 53 U/L (ref 39–117)
BILIRUBIN TOTAL: 1 mg/dL (ref 0.2–1.2)
BUN: 16 mg/dL (ref 6–23)
CO2: 28 meq/L (ref 19–32)
Calcium: 9.2 mg/dL (ref 8.4–10.5)
Chloride: 99 mEq/L (ref 96–112)
Creat: 1.11 mg/dL (ref 0.50–1.35)
GLUCOSE: 100 mg/dL — AB (ref 70–99)
Potassium: 4.3 mEq/L (ref 3.5–5.3)
SODIUM: 139 meq/L (ref 135–145)
TOTAL PROTEIN: 7.2 g/dL (ref 6.0–8.3)

## 2013-09-20 LAB — POCT CBC
Granulocyte percent: 81.4 %G — AB (ref 37–80)
HCT, POC: 48.7 % (ref 43.5–53.7)
HEMOGLOBIN: 15.4 g/dL (ref 14.1–18.1)
LYMPH, POC: 0.8 (ref 0.6–3.4)
MCH: 29.7 pg (ref 27–31.2)
MCHC: 31.6 g/dL — AB (ref 31.8–35.4)
MCV: 94.1 fL (ref 80–97)
MID (CBC): 0.4 (ref 0–0.9)
MPV: 10.8 fL (ref 0–99.8)
PLATELET COUNT, POC: 123 10*3/uL — AB (ref 142–424)
POC Granulocyte: 5 (ref 2–6.9)
POC LYMPH %: 12.9 % (ref 10–50)
POC MID %: 5.7 % (ref 0–12)
RBC: 5.18 M/uL (ref 4.69–6.13)
RDW, POC: 12.8 %
WBC: 6.2 10*3/uL (ref 4.6–10.2)

## 2013-09-20 LAB — LIPASE: Lipase: 22 U/L (ref 0–75)

## 2013-09-20 LAB — POCT URINALYSIS DIPSTICK
Glucose, UA: NEGATIVE
Ketones, UA: 15
Leukocytes, UA: NEGATIVE
NITRITE UA: NEGATIVE
PH UA: 6
PROTEIN UA: 100
Spec Grav, UA: 1.02
Urobilinogen, UA: 1

## 2013-09-20 LAB — AMYLASE: Amylase: 29 U/L (ref 0–105)

## 2013-09-20 MED ORDER — ONDANSETRON 4 MG PO TBDP
8.0000 mg | ORAL_TABLET | Freq: Once | ORAL | Status: AC
  Administered 2013-09-20: 8 mg via ORAL

## 2013-09-20 NOTE — Progress Notes (Addendum)
[facility] [address], [place] [address] [phone]  Date:  09/20/2013   Name:  Brian BeneBlake P Brian   DOB:  02/14/1986   MRN:  [id number]  PCP:  No PCP Per Patient    Chief Complaint: Nausea, Emesis and Abdominal Pain   History of Present Illness:  Brian BeneBlake P Brian is a 28 y.o. very pleasant male patient who presents with the following:  Ill since Tuesday night with fever to 103, abdominal pain, nausea, vomiting, and diarrhea watery in nature.  The patient has no complaint of blood, mucous, or pus in her stools.  [facility] [place] and told he had "heat exhaustion".  No cough or coryza.  No food intolerance.  History of appendectomy.  No icterus.  No improvement with over the counter medications (imodium and zofran) or other home remedies. Denies other complaint or health concern today.   There are no active problems to display for this patient.   No past medical history on file.  Past Surgical History  Procedure Laterality Date  . Appendectomy      History  Substance Use Topics  . Smoking status: Never Smoker   . Smokeless tobacco: Not on file  . Alcohol Use: Yes    Family History  Problem Relation Age of Onset  . Heart disease Father   . Heart disease Paternal Grandfather     pacemaker  . Diabetes Paternal Grandfather     No Known Allergies  Medication list has been reviewed and updated.  Current Outpatient Prescriptions on File Prior to Visit  Medication Sig Dispense Refill  . azithromycin (ZITHROMAX Z-PAK) 250 MG tablet Use as a zpack  6 each  0  . benzonatate (TESSALON) 100 MG capsule Take 1-2 capsules (100-200 mg total) by mouth 3 (three) times daily as needed for cough.  40 capsule  0  . chlorpheniramine-HYDROcodone (TUSSIONEX PENNKINETIC ER) 10-8 MG/5ML LQCR Take 5 mLs by mouth every 12 (twelve) hours as needed for cough (cough).  140 mL  0  . imiquimod (ALDARA) 5 % cream Apply topically 3 (three) times a week.  12 each  5   No  current facility-administered medications on file prior to visit.    Review of Systems:  As per HPI, otherwise negative.    Physical Examination: Filed Vitals:   09/20/13 1857  BP: 110/78  Pulse: 88  Temp: 99.6 F (37.6 C)  Resp: 18   Filed Vitals:   09/20/13 1857  Height: 5' 11.25" (1.81 m)  Weight: 150 lb 12.8 oz (68.402 kg)   Body mass index is 20.88 kg/(m^2). Ideal Body Weight: Weight in (lb) to have BMI = 25: 180.1  GEN: WDWN, NAD, Non-toxic, A & O x 3 HEENT: Atraumatic, Normocephalic. Neck supple. No masses, No LAD. Ears and Nose: No external deformity. CV: RRR, No M/G/R. No JVD. No thrill. No extra heart sounds. PULM: CTA B, no wheezes, crackles, rhonchi. No retractions. No resp. distress. No accessory muscle use. ABD: S, NT, ND, +BS. No rebound. No HSM. EXTR: No c/c/e NEURO Normal gait.  PSYCH: Normally interactive. Conversant. Not depressed or anxious appearing.  Calm demeanor.    Assessment and Plan: Gastroenteritis Abdominal pain Fluids Recommended ER but refused  Signed,  Phillips OdorJeffery Sallie Staron, MD   Results for orders placed in visit on 09/20/13  POCT CBC      Result Value Range   WBC 6.2  4.6 - 10.2 K/uL   Lymph, poc 0.8  0.6 - 3.4   POC LYMPH PERCENT 12.9  10 - 50 %L   MID (cbc) 0.4  0 - 0.9   POC MID % 5.7  0 - 12 %M   POC Granulocyte 5.0  2 - 6.9   Granulocyte percent 81.4 (*) 37 - 80 %G   RBC 5.18  4.69 - 6.13 M/uL   Hemoglobin 15.4  14.1 - 18.1 g/dL   HCT, POC 27.0  62.3 - 53.7 %   MCV 94.1  80 - 97 fL   MCH, POC 29.7  27 - 31.2 pg   MCHC 31.6 (*) 31.8 - 35.4 g/dL   RDW, POC 76.2     Platelet Count, POC 123 (*) 142 - 424 K/uL   MPV 10.8  0 - 99.8 fL  POCT URINALYSIS DIPSTICK      Result Value Range   Color, UA yellow     Clarity, UA clear     Glucose, UA neg     Bilirubin, UA small     Ketones, UA 15     Spec Grav, UA 1.020     Blood, UA trace     pH, UA 6.0     Protein, UA 100     Urobilinogen, UA 1.0     Nitrite, UA neg      Leukocytes, UA Negative      [facility] reading (PRIMARY) by  Dr. Dareen Piano.  No free air or obstruction.

## 2013-09-20 NOTE — Patient Instructions (Signed)
Abdominal Pain, Adult °Many things can cause abdominal pain. Usually, abdominal pain is not caused by a disease and will improve without treatment. It can often be observed and treated at home. Your health care provider will do a physical exam and possibly order blood tests and X-rays to help determine the seriousness of your pain. However, in many cases, more time must pass before a clear cause of the pain can be found. Before that point, your health care provider may not know if you need more testing or further treatment. °HOME CARE INSTRUCTIONS  °Monitor your abdominal pain for any changes. The following actions may help to alleviate any discomfort you are experiencing: °· Only take over-the-counter or prescription medicines as directed by your health care provider. °· Do not take laxatives unless directed to do so by your health care provider. °· Try a clear liquid diet (broth, tea, or water) as directed by your health care provider. Slowly move to a bland diet as tolerated. °SEEK MEDICAL CARE IF: °· You have unexplained abdominal pain. °· You have abdominal pain associated with nausea or diarrhea. °· You have pain when you urinate or have a bowel movement. °· You experience abdominal pain that wakes you in the night. °· You have abdominal pain that is worsened or improved by eating food. °· You have abdominal pain that is worsened with eating fatty foods. °SEEK IMMEDIATE MEDICAL CARE IF:  °· Your pain does not go away within 2 hours. °· You have a fever. °· You keep throwing up (vomiting). °· Your pain is felt only in portions of the abdomen, such as the right side or the left lower portion of the abdomen. °· You pass bloody or black tarry stools. °MAKE SURE YOU: °· Understand these instructions.   °· Will watch your condition.   °· Will get help right away if you are not doing well or get worse.   °Document Released: 05/12/2005 Document Revised: 05/23/2013 Document Reviewed: 04/11/2013 °Exit[employer]® Patient  Information ©2014 [employer]. ° °

## 2013-09-21 ENCOUNTER — Encounter (HOSPITAL_COMMUNITY): Payer: Self-pay | Admitting: Emergency Medicine

## 2013-09-21 ENCOUNTER — Emergency Department (HOSPITAL_COMMUNITY)
Admission: EM | Admit: 2013-09-21 | Discharge: 2013-09-21 | Disposition: A | Discharge status: Home / Self Care | Payer: BC Managed Care – PPO | Attending: Emergency Medicine | Admitting: Emergency Medicine

## 2013-09-21 DIAGNOSIS — R197 Diarrhea, unspecified: Secondary | ICD-10-CM | POA: Insufficient documentation

## 2013-09-21 DIAGNOSIS — R109 Unspecified abdominal pain: Secondary | ICD-10-CM

## 2013-09-21 DIAGNOSIS — R1084 Generalized abdominal pain: Secondary | ICD-10-CM | POA: Insufficient documentation

## 2013-09-21 DIAGNOSIS — Z79899 Other long term (current) drug therapy: Secondary | ICD-10-CM | POA: Insufficient documentation

## 2013-09-21 DIAGNOSIS — R509 Fever, unspecified: Secondary | ICD-10-CM | POA: Insufficient documentation

## 2013-09-21 LAB — GI PATHOGEN PANEL BY PCR, STOOL
C DIFFICILE TOXIN A/B: NEGATIVE
CAMPYLOBACTER BY PCR: NEGATIVE
CRYPTOSPORIDIUM BY PCR: NEGATIVE
E COLI (ETEC) LT/ST: NEGATIVE
E COLI (STEC): NEGATIVE
E coli 0157 by PCR: NEGATIVE
G lamblia by PCR: NEGATIVE
Norovirus GI/GII: NEGATIVE
ROTAVIRUS A BY PCR: POSITIVE
Salmonella by PCR: NEGATIVE
Shigella by PCR: NEGATIVE

## 2013-09-21 LAB — COMPREHENSIVE METABOLIC PANEL
ALK PHOS: 48 U/L (ref 39–117)
ALT: 23 U/L (ref 0–53)
AST: 22 U/L (ref 0–37)
Albumin: 3.6 g/dL (ref 3.5–5.2)
BUN: 12 mg/dL (ref 6–23)
CO2: 24 meq/L (ref 19–32)
Calcium: 8.4 mg/dL (ref 8.4–10.5)
Chloride: 101 mEq/L (ref 96–112)
Creatinine, Ser: 1.08 mg/dL (ref 0.50–1.35)
GFR calc Af Amer: >90 mL/min (ref >90)
GFR calc non Af Amer: >90 mL/min (ref >90)
Glucose, Bld: 90 mg/dL (ref 70–99)
Potassium: 3.7 mEq/L (ref 3.7–5.3)
SODIUM: 138 meq/L (ref 137–147)
TOTAL PROTEIN: 7 g/dL (ref 6.0–8.3)
Total Bilirubin: 0.8 mg/dL (ref 0.3–1.2)

## 2013-09-21 LAB — CBC WITH DIFFERENTIAL/PLATELET
Basophils Absolute: 0 10*3/uL (ref 0.0–0.1)
Basophils Relative: 0 % (ref 0–1)
Eosinophils Absolute: 0.2 10*3/uL (ref 0.0–0.7)
Eosinophils Relative: 3 % (ref 0–5)
HCT: 46.6 % (ref 39.0–52.0)
Hemoglobin: 16.2 g/dL (ref 13.0–17.0)
LYMPHS ABS: 1.3 10*3/uL (ref 0.7–4.0)
LYMPHS PCT: 18 % (ref 12–46)
MCH: 30.5 pg (ref 26.0–34.0)
MCHC: 34.8 g/dL (ref 30.0–36.0)
MCV: 87.8 fL (ref 78.0–100.0)
Monocytes Absolute: 0.5 10*3/uL (ref 0.1–1.0)
Monocytes Relative: 8 % (ref 3–12)
NEUTROS ABS: 5.1 10*3/uL (ref 1.7–7.7)
NEUTROS PCT: 72 % (ref 43–77)
Platelets: 140 10*3/uL — ABNORMAL LOW (ref 150–400)
RBC: 5.31 MIL/uL (ref 4.22–5.81)
RDW: 12.3 % (ref 11.5–15.5)
WBC: 7 10*3/uL (ref 4.0–10.5)

## 2013-09-21 MED ORDER — DICYCLOMINE HCL 10 MG PO CAPS
20.0000 mg | ORAL_CAPSULE | Freq: Once | ORAL | Status: AC
  Administered 2013-09-21: 20 mg via ORAL
  Filled 2013-09-21: qty 2

## 2013-09-21 MED ORDER — SODIUM CHLORIDE 0.9 % IV BOLUS (SEPSIS)
1000.0000 mL | Freq: Once | INTRAVENOUS | Status: AC
  Administered 2013-09-21: 1000 mL via INTRAVENOUS

## 2013-09-21 MED ORDER — DICYCLOMINE HCL 20 MG PO TABS: 20.0000 mg | ORAL_TABLET | Freq: Four times a day (QID) | ORAL | Status: DC | PRN

## 2013-09-21 MED ORDER — FENTANYL CITRATE 0.05 MG/ML IJ SOLN
50.0000 ug | Freq: Once | INTRAMUSCULAR | Status: DC
  Filled 2013-09-21: qty 2

## 2013-09-21 NOTE — ED Provider Notes (Signed)
CSN: [id number]     Arrival date & time 09/21/13  0220 History   First MD Initiated Contact with Patient 09/21/13 0255     No chief complaint on file.  (Consider location/radiation/quality/duration/timing/severity/associated sxs/prior Treatment) HPI 28 year old male presents emergency department from home with complaint of persistent diarrhea.  Patient reports onset of nausea, vomiting, and diarrhea, starting Tuesday night, with fevers to 102.  Patient had persistent fevers until Wednesday.  He was seen at urgent care, and dental [place].  He was diagnosed with heat exhaustion and prescribed Zofran.  Patient has taken Pepto bismol, tylenol for the fevers, and Imodium without improvement in symptoms.  He reports diffuse severe abdominal cramping at times.  Nausea and vomiting has nearly resolved, however, he is still having diarrhea.  He was seen earlier tonight at an urgent care, and recommended to go to the emergency room, which he refused at that time.  Patient now presents to emergency room due to diarrhea every 15 minutes, and severe abdominal cramping.  Patient is status post appendectomy.  Pain is lower abdomen in a band.  No known sick contacts, no unusual foods, no travel.  Patient reports stools have become black since taking the Pepto-Bismol.  No blood or mucus in in his stool.  No recent antibiotics. History reviewed. No pertinent past medical history. Past Surgical History  Procedure Laterality Date  . Appendectomy     Family History  Problem Relation Age of Onset  . Heart disease Father   . Heart disease Paternal Grandfather     pacemaker  . Diabetes Paternal Grandfather    History  Substance Use Topics  . Smoking status: Never Smoker   . Smokeless tobacco: Not on file  . Alcohol Use: Yes    Review of Systems  See History of Present Illness; otherwise all other systems are reviewed and negative Allergies  Review of patient's allergies indicates no known allergies.  Home  Medications   Current Outpatient Rx  Name  Route  Sig  Dispense  Refill  . bismuth subsalicylate (PEPTO BISMOL) 262 MG/15ML suspension   Oral   Take 30 mLs by mouth every 6 (six) hours as needed.         Marland Kitchen ibuprofen (ADVIL,MOTRIN) 200 MG tablet   Oral   Take 400 mg by mouth every 6 (six) hours as needed for fever.         . loperamide (IMODIUM) 2 MG capsule   Oral   Take 4 mg by mouth as needed for diarrhea or loose stools.         . Omega-3 Fatty Acids (FISH OIL PO)   Oral   Take 1 capsule by mouth daily.         . ondansetron (ZOFRAN-ODT) 4 MG disintegrating tablet   Oral   Take 4 mg by mouth every 8 (eight) hours as needed for nausea or vomiting.         . dicyclomine (BENTYL) 20 MG tablet   Oral   Take 1 tablet (20 mg total) by mouth every 6 (six) hours as needed for spasms (for abdominal cramping).   20 tablet   0    BP 115/66  Pulse 70  Temp(Src) 97.8 F (36.6 C) (Oral)  Resp 18  SpO2 98% Physical Exam  Nursing note and vitals reviewed. Constitutional: He is oriented to person, place, and time. He appears well-developed and well-nourished.  HENT:  Head: Normocephalic and atraumatic.  Nose: Nose normal.  Mouth/Throat: Oropharynx is  clear and moist.  Eyes: Conjunctivae and EOM are normal. Pupils are equal, round, and reactive to light.  Neck: Normal range of motion. Neck supple. No JVD present. No tracheal deviation present. No thyromegaly present.  Cardiovascular: Normal rate, regular rhythm, normal heart sounds and intact distal pulses.  Exam reveals no gallop and no friction rub.   No murmur heard. Pulmonary/Chest: Effort normal and breath sounds normal. No stridor. No respiratory distress. He has no wheezes. He has no rales. He exhibits no tenderness.  Abdominal: Soft. He exhibits no distension and no mass. There is tenderness (diffuse tenderness across lower abdomen). There is no rebound and no guarding.  Hyperactive bowel sounds  Musculoskeletal:  Normal range of motion. He exhibits no edema and no tenderness.  Lymphadenopathy:    He has no cervical adenopathy.  Neurological: He is alert and oriented to person, place, and time. He exhibits normal muscle tone. Coordination normal.  Skin: Skin is warm and dry. No rash noted. No erythema. No pallor.  Psychiatric: He has a normal mood and affect. His behavior is normal. Judgment and thought content normal.    ED Course  Procedures (including critical care time) Labs Review Labs Reviewed  CBC WITH DIFFERENTIAL - Abnormal; Notable for the following:    Platelets 140 (*)    All other components within normal limits  COMPREHENSIVE METABOLIC PANEL  GI PATHOGEN PANEL BY PCR, STOOL   Imaging Review No results found.  EKG Interpretation   None       MDM   1. Diarrhea   2. Abdominal cramping    28 year old male with possible food poisoning, versus viral gastroenteritis.  Will check labs, give IV fluids, Bentyl for abdominal cramping.  Given persistence of symptoms and possible food poisoning, will send stool sample for GI pathogens PCR.  If patient has persistent diarrhea, may need admission, but this time, he looks overall well.    Olivia Mackielga M Chadley Dziedzic, MD 09/21/13 [phone]

## 2013-09-21 NOTE — ED Notes (Signed)
C/o diarrhea since Tuesday.  Started with cp, now isolated in rt. Lower quadrant. Stools black..did take Pepto bismol

## 2013-09-21 NOTE — Discharge Instructions (Signed)
Abdominal Pain, Adult  Many things can cause abdominal pain. Usually, abdominal pain is not caused by a disease and will improve without treatment. It can often be observed and treated at home. Your health care provider will do a physical exam and possibly order blood tests and X-rays to help determine the seriousness of your pain. However, in many cases, more time must pass before a clear cause of the pain can be found. Before that point, your health care provider may not know if you need more testing or further treatment.  HOME CARE INSTRUCTIONS   Monitor your abdominal pain for any changes. The following actions may help to alleviate any discomfort you are experiencing:   Only take over-the-counter or prescription medicines as directed by your health care provider.   Do not take laxatives unless directed to do so by your health care provider.   Try a clear liquid diet (broth, tea, or water) as directed by your health care provider. Slowly move to a bland diet as tolerated.  SEEK MEDICAL CARE IF:   You have unexplained abdominal pain.   You have abdominal pain associated with nausea or diarrhea.   You have pain when you urinate or have a bowel movement.   You experience abdominal pain that wakes you in the night.   You have abdominal pain that is worsened or improved by eating food.   You have abdominal pain that is worsened with eating fatty foods.  SEEK IMMEDIATE MEDICAL CARE IF:    Your pain does not go away within 2 hours.   You have a fever.   You keep throwing up (vomiting).   Your pain is felt only in portions of the abdomen, such as the right side or the left lower portion of the abdomen.   You pass bloody or black tarry stools.  MAKE SURE YOU:   Understand these instructions.    Will watch your condition.    Will get help right away if you are not doing well or get worse.   Document Released: 05/12/2005 Document Revised: 05/23/2013 Document Reviewed: 04/11/2013  [employer] Patient  Information 2014 [employer].  Diet for Diarrhea, Adult  Frequent, runny stools (diarrhea) may be caused or worsened by food or drink. Diarrhea may be relieved by changing your diet. Since diarrhea can last up to 7 days, it is easy for you to lose too much fluid from the body and become dehydrated. Fluids that are lost need to be replaced. Along with a modified diet, make sure you drink enough fluids to keep your urine clear or pale yellow.  DIET INSTRUCTIONS   Ensure adequate fluid intake (hydration): have 1 cup (8 oz) of fluid for each diarrhea episode. Avoid fluids that contain simple sugars or sports drinks, fruit juices, whole milk products, and sodas. Your urine should be clear or pale yellow if you are drinking enough fluids. Hydrate with an oral rehydration solution that you can purchase at pharmacies, retail stores, and online. You can prepare an oral rehydration solution at home by mixing the following ingredients together:     tsp table salt.    tsp baking soda.    tsp salt substitute containing potassium chloride.   1  tablespoons sugar.   1 L (34 oz) of water.   Certain foods and beverages may increase the speed at which food moves through the gastrointestinal (GI) tract. These foods and beverages should be avoided and include:   Caffeinated and alcoholic beverages.   High-fiber   foods, such as raw fruits and vegetables, nuts, seeds, and whole grain breads and cereals.   Foods and beverages sweetened with sugar alcohols, such as xylitol, sorbitol, and mannitol.   Some foods may be well tolerated and may help thicken stool including:   Starchy foods, such as rice, toast, pasta, low-sugar cereal, oatmeal, grits, baked potatoes, crackers, and bagels.    Bananas.    Applesauce.   Add probiotic-rich foods to help increase healthy bacteria in the GI tract, such as yogurt and fermented milk products.  RECOMMENDED FOODS AND BEVERAGES  Starches  Choose foods with less than 2 g of fiber per  serving.   Recommended:  White, French, and pita breads, plain rolls, buns, bagels. Plain muffins, matzo. Soda, saltine, or graham crackers. Pretzels, melba toast, zwieback. Cooked cereals made with water: cornmeal, farina, cream cereals. Dry cereals: refined corn, wheat, rice. Potatoes prepared any way without skins, refined macaroni, spaghetti, noodles, refined rice.   Avoid:  Bread, rolls, or crackers made with whole wheat, multi-grains, rye, bran seeds, nuts, or coconut. Corn tortillas or taco shells. Cereals containing whole grains, multi-grains, bran, coconut, nuts, raisins. Cooked or dry oatmeal. Coarse wheat cereals, granola. Cereals advertised as "high-fiber." Potato skins. Whole grain pasta, wild or brown rice. Popcorn. Sweet potatoes, yams. Sweet rolls, doughnuts, waffles, pancakes, sweet breads.  Vegetables   Recommended: Strained tomato and vegetable juices. Most well-cooked and canned vegetables without seeds. Fresh: Tender lettuce, cucumber without the skin, cabbage, spinach, bean sprouts.   Avoid: Fresh, cooked, or canned: Artichokes, baked beans, beet greens, broccoli, Brussels sprouts, corn, kale, legumes, peas, sweet potatoes. Cooked: Green or red cabbage, spinach. Avoid large servings of any vegetables because vegetables shrink when cooked, and they contain more fiber per serving than fresh vegetables.  Fruit   Recommended: Cooked or canned: Apricots, applesauce, cantaloupe, cherries, fruit cocktail, grapefruit, grapes, kiwi, mandarin oranges, peaches, pears, plums, watermelon. Fresh: Apples without skin, ripe banana, grapes, cantaloupe, cherries, grapefruit, peaches, oranges, plums. Keep servings limited to  cup or 1 piece.   Avoid: Fresh: Apples with skin, apricots, mangoes, pears, raspberries, strawberries. Prune juice, stewed or dried prunes. Dried fruits, raisins, dates. Large servings of all fresh fruits.  Protein   Recommended: Ground or well-cooked tender beef, ham, veal, lamb,  pork, or poultry. Eggs. Fish, oysters, shrimp, lobster, other seafoods. Liver, organ meats.   Avoid: Tough, fibrous meats with gristle. Peanut butter, smooth or chunky. Cheese, nuts, seeds, legumes, dried peas, beans, lentils.  Dairy   Recommended: Yogurt, lactose-free milk, kefir, drinkable yogurt, buttermilk, soy milk, or plain hard cheese.   Avoid: Milk, chocolate milk, beverages made with milk, such as milkshakes.  Soups   Recommended: Bouillon, broth, or soups made from allowed foods. Any strained soup.   Avoid: Soups made from vegetables that are not allowed, cream or milk-based soups.  Desserts and Sweets   Recommended: Sugar-free gelatin, sugar-free frozen ice pops made without sugar alcohol.   Avoid: Plain cakes and cookies, pie made with fruit, pudding, custard, cream pie. Gelatin, fruit, ice, sherbet, frozen ice pops. Ice cream, ice milk without nuts. Plain hard candy, honey, jelly, molasses, syrup, sugar, chocolate syrup, gumdrops, marshmallows.  Fats and Oils   Recommended: Limit fats to less than 8 tsp per day.   Avoid: Seeds, nuts, olives, avocados. Margarine, butter, cream, mayonnaise, salad oils, plain salad dressings. Plain gravy, crisp bacon without rind.  Beverages   Recommended: Water, decaffeinated teas, oral rehydration solutions, sugar-free beverages not sweetened with sugar alcohols.     Avoid: Fruit juices, caffeinated beverages (coffee, tea, soda), alcohol, sports drinks, or lemon-lime soda.  Condiments   Recommended: Ketchup, mustard, horseradish, vinegar, cocoa powder. Spices in moderation: allspice, basil, bay leaves, celery powder or leaves, cinnamon, cumin powder, curry powder, ginger, mace, marjoram, onion or garlic powder, oregano, paprika, parsley flakes, ground pepper, rosemary, sage, savory, tarragon, thyme, turmeric.   Avoid: Coconut, honey.  Document Released: 10/23/2003 Document Revised: 04/26/2012 Document Reviewed: 12/17/2011  Exit[employer] Patient Information 2014  [employer]  Diarrhea  Diarrhea is frequent loose and watery bowel movements. It can cause you to feel weak and dehydrated. Dehydration can cause you to become tired and thirsty, have a dry mouth, and have decreased urination that often is dark yellow. Diarrhea is a sign of another problem, most often an infection that will not last long. In most cases, diarrhea typically lasts 2 3 days. However, it can last longer if it is a sign of something more serious. It is important to treat your diarrhea as directed by your caregive to lessen or prevent future episodes of diarrhea.  CAUSES   Some common causes include:   Gastrointestinal infections caused by viruses, bacteria, or parasites.   Food poisoning or food allergies.   Certain medicines, such as antibiotics, chemotherapy, and laxatives.   Artificial sweeteners and fructose.   Digestive disorders.  HOME CARE INSTRUCTIONS   Ensure adequate fluid intake (hydration): have 1 cup (8 oz) of fluid for each diarrhea episode. Avoid fluids that contain simple sugars or sports drinks, fruit juices, whole milk products, and sodas. Your urine should be clear or pale yellow if you are drinking enough fluids. Hydrate with an oral rehydration solution that you can purchase at pharmacies, retail stores, and online. You can prepare an oral rehydration solution at home by mixing the following ingredients together:     tsp table salt.    tsp baking soda.    tsp salt substitute containing potassium chloride.   1  tablespoons sugar.   1 L (34 oz) of water.   Certain foods and beverages may increase the speed at which food moves through the gastrointestinal (GI) tract. These foods and beverages should be avoided and include:   Caffeinated and alcoholic beverages.   High-fiber foods, such as raw fruits and vegetables, nuts, seeds, and whole grain breads and cereals.   Foods and beverages sweetened with sugar alcohols, such as xylitol, sorbitol, and mannitol.   Some foods  may be well tolerated and may help thicken stool including:   Starchy foods, such as rice, toast, pasta, low-sugar cereal, oatmeal, grits, baked potatoes, crackers, and bagels.   Bananas.   Applesauce.   Add probiotic-rich foods to help increase healthy bacteria in the GI tract, such as yogurt and fermented milk products.   Wash your hands well after each diarrhea episode.   Only take over-the-counter or prescription medicines as directed by your caregiver.   Take a warm bath to relieve any burning or pain from frequent diarrhea episodes.  SEEK IMMEDIATE MEDICAL CARE IF:    You are unable to keep fluids down.   You have persistent vomiting.   You have blood in your stool, or your stools are black and tarry.   You do not urinate in 6 8 hours, or there is only a small amount of very dark urine.   You have abdominal pain that increases or localizes.   You have weakness, dizziness, confusion, or lightheadedness.   You have a severe headache.     Your diarrhea gets worse or does not get better.   You have a fever or persistent symptoms for more than 2 3 days.   You have a fever and your symptoms suddenly get worse.  MAKE SURE YOU:    Understand these instructions.   Will watch your condition.   Will get help right away if you are not doing well or get worse.  Document Released: 07/23/2002 Document Revised: 07/19/2012 Document Reviewed: 04/09/2012  [employer] Patient Information 2014 [employer].

## 2013-09-21 NOTE — ED Notes (Signed)
Green, soft liquid stool

## 2013-09-25 ENCOUNTER — Telehealth: Payer: Self-pay

## 2013-09-25 NOTE — Telephone Encounter (Signed)
Pt called back about labs but I was busy. Unable to see the results in my result pool. Let him know everything was normal.

## 2014-05-26 ENCOUNTER — Ambulatory Visit: Payer: Self-pay

## 2014-05-26 ENCOUNTER — Ambulatory Visit (INDEPENDENT_AMBULATORY_CARE_PROVIDER_SITE_OTHER): Payer: BC Managed Care – PPO | Admitting: Family Medicine

## 2014-05-26 ENCOUNTER — Ambulatory Visit (INDEPENDENT_AMBULATORY_CARE_PROVIDER_SITE_OTHER): Payer: BC Managed Care – PPO

## 2014-05-26 VITALS — BP 138/84 | HR 74 | Temp 98.3°F | Resp 20 | Ht 70.0 in | Wt 155.2 lb

## 2014-05-26 DIAGNOSIS — J209 Acute bronchitis, unspecified: Secondary | ICD-10-CM

## 2014-05-26 DIAGNOSIS — A630 Anogenital (venereal) warts: Secondary | ICD-10-CM

## 2014-05-26 LAB — POCT CBC
GRANULOCYTE PERCENT: 74.7 % (ref 37–80)
HCT, POC: 47.4 % (ref 43.5–53.7)
Hemoglobin: 15.4 g/dL (ref 14.1–18.1)
Lymph, poc: 1.7 (ref 0.6–3.4)
MCH, POC: 29 pg (ref 27–31.2)
MCHC: 32.6 g/dL (ref 31.8–35.4)
MCV: 89 fL (ref 80–97)
MID (CBC): 0.7 (ref 0–0.9)
MPV: 8.3 fL (ref 0–99.8)
PLATELET COUNT, POC: 157 10*3/uL (ref 142–424)
POC GRANULOCYTE: 7.1 — AB (ref 2–6.9)
POC LYMPH %: 17.5 % (ref 10–50)
POC MID %: 7.8 %M (ref 0–12)
RBC: 5.32 M/uL (ref 4.69–6.13)
RDW, POC: 13.1 %
WBC: 9.5 10*3/uL (ref 4.6–10.2)

## 2014-05-26 MED ORDER — HYDROCOD POLST-CHLORPHEN POLST 10-8 MG/5ML PO LQCR: 5.0000 mL | Freq: Two times a day (BID) | ORAL | Status: DC | PRN

## 2014-05-26 MED ORDER — AZITHROMYCIN 250 MG PO TABS: ORAL_TABLET | ORAL | Status: DC

## 2014-05-26 NOTE — Progress Notes (Addendum)
Subjective:    Patient ID: Brian MeekerBlake Patrick Dengel, male    DOB: 10/17/1985, 28 y.o.   MRN: [id number]  Sore Throat  Associated symptoms include congestion and coughing. Pertinent negatives include no vomiting.  Cough Associated symptoms include chills and a sore throat. Pertinent negatives include no fever.   Chief Complaint  Patient presents with  . Sore Throat    about 2 weeks--not sure if he had a fever  . Cough    productive cough with greenish mucous/chest congestion  . Genital Warts    discuss possible referral  . Nasal Congestion  . Nausea   This chart was scribed for Norberto SorensonEva Lashante Fryberger, MD by Andrew Auaven Small, ED Scribe. This patient was seen in room 13 and the patient's care was started at 4:31 PM.  HPI Comments: Brian Blake is a 28 y.o. male who presents to the [facility] with multiple complaints.Pt reports symptoms started 2 weeks ago with sore throat. He states sore throat had improved but began to have mucous drainage from nose that progressed to an intermittent productive cough consisting of yellow green sputum, nasal congestion for the past week. Pt began to have chills today. Pt has been unable to sleep due to symptoms. Pt has taken mucinex and sudafed.  Pt reports small BM described as rabbit pellets which is not normal. Pt denies taking medication.  Pt denies change in appetite. Pt reports exposure to bronchitis from his grandfather who was hospitalized. Pt denies smoking. 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   Pt also c/o gental warts.  Pt was seen by me 1 year ago and had several treatments with cyrotherapy a aldara. Pt reports no improvement with aldara.   History reviewed. No pertinent past medical history. Past Surgical History  Procedure Laterality Date  . Appendectomy     Prior to Admission medications   Medication Sig Start Date End Date Taking? Authorizing Provider  bismuth subsalicylate (PEPTO BISMOL) 262 MG/15ML suspension Take 30 mLs by mouth  every 6 (six) hours as needed.    Historical Provider, MD  dicyclomine (BENTYL) 20 MG tablet Take 1 tablet (20 mg total) by mouth every 6 (six) hours as needed for spasms (for abdominal cramping). 09/21/13   Olivia Mackielga M Otter, MD  ibuprofen (ADVIL,MOTRIN) 200 MG tablet Take 400 mg by mouth every 6 (six) hours as needed for fever.    Historical Provider, MD  loperamide (IMODIUM) 2 MG capsule Take 4 mg by mouth as needed for diarrhea or loose stools.    Historical Provider, MD  Omega-3 Fatty Acids (FISH OIL PO) Take 1 capsule by mouth daily.    Historical Provider, MD  ondansetron (ZOFRAN-ODT) 4 MG disintegrating tablet Take 4 mg by mouth every 8 (eight) hours as needed for nausea or vomiting.    Historical Provider, MD   Review of Systems  Constitutional: Positive for chills. Negative for fever.  HENT: Positive for congestion and sore throat.   Respiratory: Positive for cough.   Gastrointestinal: Positive for nausea. Negative for vomiting.    Objective:   Physical Exam  Nursing note and vitals reviewed. Constitutional: He is oriented to person, place, and time. He appears well-developed and well-nourished. No distress.  HENT:  Head: Normocephalic and atraumatic.  Right Ear: A middle ear effusion is present.  Left Ear: A middle ear effusion is present.  Nose: Mucosal edema present.  Mouth/Throat: Posterior oropharyngeal erythema present. No oropharyngeal exudate or posterior oropharyngeal edema.  Eyes: Conjunctivae and  EOM are normal.  Neck: Neck supple.  Cardiovascular: Normal rate, regular rhythm and normal heart sounds.  Exam reveals no gallop and no friction rub.   No murmur heard. Pulmonary/Chest: Effort normal. He has rhonchi ( expiratory).  Musculoskeletal: Normal range of motion.  Lymphadenopathy:       Head (right side): Tonsillar adenopathy present.       Head (left side): Tonsillar adenopathy present.    He has cervical adenopathy.       Right cervical: Posterior cervical  adenopathy present.       Left cervical: Posterior cervical adenopathy present.  Neurological: He is alert and oriented to person, place, and time.  Skin: Skin is warm and dry.  Psychiatric: He has a normal mood and affect. His behavior is normal.   BP 138/84  Pulse 74  Temp(Src) 98.3 F (36.8 C) (Oral)  Resp 20  Ht 5\' 10"  (1.778 m)  Wt 155 lb 3.2 oz (70.398 kg)  BMI 22.27 kg/m2  SpO2 99%  [facility] reading (PRIMARY) by  Dr. Clelia CroftShaw. CXR: no acute abnormality Assessment & Plan:   Acute bronchitis, unspecified organism - Plan: DG Chest 2 View, POCT CBC  Genital warts - Plan: Ambulatory referral to Dermatology - Unsuccessful in treatment after cryo x 2 and aldara.  Meds ordered this encounter  Medications  . azithromycin (ZITHROMAX) 250 MG tablet    Sig: Take 2 tabs PO x 1 dose, then 1 tab PO QD x 4 days    Dispense:  6 tablet    Refill:  0  . chlorpheniramine-HYDROcodone (TUSSIONEX PENNKINETIC ER) 10-8 MG/5ML LQCR    Sig: Take 5 mLs by mouth every 12 (twelve) hours as needed.    Dispense:  120 mL    Refill:  0    I personally performed the services described in this documentation, which was scribed in my presence. The recorded information has been reviewed and considered, and addended by me as needed.  Norberto SorensonEva Dannell Gortney, MD MPH  Results for orders placed in visit on 05/26/14  POCT CBC      Result Value Ref Range   WBC 9.5  4.6 - 10.2 K/uL   Lymph, poc 1.7  0.6 - 3.4   POC LYMPH PERCENT 17.5  10 - 50 %L   MID (cbc) 0.7  0 - 0.9   POC MID % 7.8  0 - 12 %M   POC Granulocyte 7.1 (*) 2 - 6.9   Granulocyte percent 74.7  37 - 80 %G   RBC 5.32  4.69 - 6.13 M/uL   Hemoglobin 15.4  14.1 - 18.1 g/dL   HCT, POC 16.147.4  09.643.5 - 53.7 %   MCV 89.0  80 - 97 fL   MCH, POC 29.0  27 - 31.2 pg   MCHC 32.6  31.8 - 35.4 g/dL   RDW, POC 04.513.1     Platelet Count, POC 157  142 - 424 K/uL   MPV 8.3  0 - 99.8 fL

## 2014-05-26 NOTE — Patient Instructions (Signed)

## 2017-08-18 ENCOUNTER — Encounter: Payer: Self-pay | Admitting: Physician Assistant

## 2017-08-18 ENCOUNTER — Other Ambulatory Visit: Payer: Self-pay

## 2017-08-18 ENCOUNTER — Ambulatory Visit (INDEPENDENT_AMBULATORY_CARE_PROVIDER_SITE_OTHER): Payer: 59 | Admitting: Physician Assistant

## 2017-08-18 VITALS — BP 122/80 | HR 59 | Temp 97.9°F | Resp 16 | Ht 70.5 in | Wt 158.4 lb

## 2017-08-18 DIAGNOSIS — J029 Acute pharyngitis, unspecified: Secondary | ICD-10-CM | POA: Diagnosis not present

## 2017-08-18 DIAGNOSIS — Z0000 Encounter for general adult medical examination without abnormal findings: Secondary | ICD-10-CM | POA: Diagnosis not present

## 2017-08-18 DIAGNOSIS — R52 Pain, unspecified: Secondary | ICD-10-CM | POA: Diagnosis not present

## 2017-08-18 LAB — INFLUENZA A AND B AG, IMMUNOASSAY
INFLUENZA A ANTIGEN: NOT DETECTED
INFLUENZA B ANTIGEN: NOT DETECTED

## 2017-08-18 MED ORDER — AMOXICILLIN 875 MG PO TABS: 875.0000 mg | ORAL_TABLET | Freq: Two times a day (BID) | ORAL | 0 refills | Status: DC

## 2017-08-18 MED ORDER — AMOXICILLIN-POT CLAVULANATE 875-125 MG PO TABS: 1.0000 | ORAL_TABLET | Freq: Two times a day (BID) | ORAL | 0 refills | Status: DC

## 2017-08-18 NOTE — Progress Notes (Signed)
Patient ID: Stephanie Mcglone MRN: [id number], DOB: 1986-07-04, 32 y.o. Date of Encounter: @DATE @  Chief Complaint:  Chief Complaint  Patient presents with  . New Patient (Initial Visit)    x3days  . Sore Throat    x3days  . Chills  . Generalized Body Aches  . Fever    low grade    HPI: 32 y.o. year old male  presents with above.   He was already scheduled for today's visit as a new patient to establish care. After scheduling the visit, he developed symptoms/illness.  Regarding medical history to establish care: He reports that he has no known chronic medical problems that need follow-up/management. He has had appendectomy.  He also reports that as a child he had some other type of surgery that he does not know what was called.  Points near her low abdomen and says that something got twisted and caused his testicle to enlarge.  Sounds like either testicular torsion or possible hernia but he is not certain. Family 3 includes father having some type of heart disease but he is not certain any details.  Does not know if he has had stent or bypass surgery etc.  States that his father's father also has heart disease and has had ICD and pacemaker but this was all in later age. No other significant history that he is aware of.  No other concerns to address today other than recent acute illness as below.  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  Works with [employer], maintenance.  Reports that for about 3 days now he has had some sore throat, low-grade fever, body aches, some cough.  Not been blowing much mucus from his nose.  No other symptoms.  No other concerns to address today.   History reviewed. No pertinent past medical history.   Home Meds: Outpatient Medications Prior to Visit  Medication Sig Dispense Refill  . ibuprofen (ADVIL,MOTRIN) 200 MG tablet Take 400 mg by mouth every 6 (six) hours as needed for fever.    . Omega-3 Fatty Acids (FISH OIL PO) Take 1  capsule by mouth daily.    Marland Kitchen azithromycin (ZITHROMAX) 250 MG tablet Take 2 tabs PO x 1 dose, then 1 tab PO QD x 4 days 6 tablet 0  . bismuth subsalicylate (PEPTO BISMOL) 262 MG/15ML suspension Take 30 mLs by mouth every 6 (six) hours as needed.    . chlorpheniramine-HYDROcodone (TUSSIONEX PENNKINETIC ER) 10-8 MG/5ML LQCR Take 5 mLs by mouth every 12 (twelve) hours as needed. 120 mL 0  . dicyclomine (BENTYL) 20 MG tablet Take 1 tablet (20 mg total) by mouth every 6 (six) hours as needed for spasms (for abdominal cramping). 20 tablet 0  . loperamide (IMODIUM) 2 MG capsule Take 4 mg by mouth as needed for diarrhea or loose stools.    . ondansetron (ZOFRAN-ODT) 4 MG disintegrating tablet Take 4 mg by mouth every 8 (eight) hours as needed for nausea or vomiting.     No facility-administered medications prior to visit.     Allergies: No Known Allergies  Social History   Socioeconomic History  . Marital status: Single    Spouse name: Not on file  . Number of children: Not on file  . Years of education: Not on file  . Highest education level: Not on file  Social Needs  . Financial resource strain: Not on file  . Food insecurity - worry: Not on file  . Food insecurity -  inability: Not on file  . Transportation needs - medical: Not on file  . Transportation needs - non-medical: Not on file  Occupational History  . Not on file  Tobacco Use  . Smoking status: Never Smoker  . Smokeless tobacco: Never Used  Substance and Sexual Activity  . Alcohol use: Yes    Comment: occ  . Drug use: No  . Sexual activity: Yes  Other Topics Concern  . Not on file  Social History Narrative  . Not on file    Family History  Problem Relation Age of Onset  . Heart disease Father   . Heart disease Paternal Grandfather        pacemaker  . Diabetes Paternal Grandfather      Review of Systems:  See HPI for pertinent ROS. All other ROS negative.    Physical Exam: Blood pressure 122/80, pulse (!) 59,  temperature 97.9 F (36.6 C), temperature source Oral, resp. rate 16, height 5' 10.5" (1.791 m), weight 71.8 kg (158 lb 6.4 oz), SpO2 98 %., Body mass index is 22.41 kg/m. General: WNWD WM. Appears in no acute distress. Head: Normocephalic, atraumatic, eyes without discharge, sclera non-icteric, nares are without discharge. Bilateral auditory canals clear, TM's are without perforation, pearly grey and translucent with reflective cone of light bilaterally. Oral cavity moist, posterior pharynx without exudate, erythema, peritonsillar abscess.  Neck: Supple. No thyromegaly. No lymphadenopathy. Lungs: Clear bilaterally to auscultation without wheezes, rales, or rhonchi. Breathing is unlabored. Heart: RRR with S1 S2. No murmurs, rubs, or gallops. Abdomen: Soft, non-tender, non-distended with normoactive bowel sounds. No hepatomegaly. No rebound/guarding. No obvious abdominal masses. Musculoskeletal:  Strength and tone normal for age. Extremities/Skin: Warm and dry.  Neuro: Alert and oriented X 3. Moves all extremities spontaneously. Gait is normal. CNII-XII grossly in tact. Psych:  Responds to questions appropriately with a normal affect.   Results for orders placed or performed in visit on 08/18/17  STREP GROUP A AG, W/REFLEX TO CULT  Result Value Ref Range   Streptococcus, Group A Screen (Direct) NONE DETECTED   Influenza A and B Ag, Immunoassay  Result Value Ref Range   Source: NASOPHARYNX    INFLUENZA A ANTIGEN NOT DETECTED NOT DETECT   INFLUENZA B ANTIGEN NOT DETECTED NOT DETECT     ASSESSMENT AND PLAN:  32 y.o. year old male with   1. Encounter for medical examination to establish care He will follow-up as needed.  2. Acute pharyngitis, unspecified etiology Discussed that strep test and flu test are negative. Discussed that this is most likely caused by a virus which means that it will run its course and resolve on its own. If symptoms were to worsen over the next couple of days  or persist greater than 7 days then needs to take antibiotic.   If this is the case, then he needs to take the antibiotic as directed and complete all 7 days.   Prescription sent for amoxicillin 875 mg 1 p.o. twice daily times 7 days.  Note given to cover him missing work.  3. Generalized body aches - Influenza virus ag, a+b (DFA)  4. Sore throat - STREP GROUP A AG, W/REFLEX TO CULT     Signed, [address]Niara Bunker Beth WhitesboroDixon, [place], [facility] 08/18/2017 2:43 PM

## 2017-08-20 LAB — CULTURE, GROUP A STREP
MICRO NUMBER:: 90008663
SOURCE:: 0
SPECIMEN QUALITY:: ADEQUATE

## 2017-08-20 LAB — STREP GROUP A AG, W/REFLEX TO CULT: Streptococcus, Group A Screen (Direct): NOT DETECTED

## 2017-09-27 DIAGNOSIS — J101 Influenza due to other identified influenza virus with other respiratory manifestations: Secondary | ICD-10-CM | POA: Diagnosis not present

## 2017-09-27 DIAGNOSIS — J209 Acute bronchitis, unspecified: Secondary | ICD-10-CM | POA: Diagnosis not present

## 2017-10-24 ENCOUNTER — Ambulatory Visit: Payer: 59 | Admitting: Physician Assistant

## 2017-10-24 ENCOUNTER — Encounter: Payer: Self-pay | Admitting: Physician Assistant

## 2017-10-24 ENCOUNTER — Other Ambulatory Visit: Payer: Self-pay

## 2017-10-24 VITALS — BP 140/82 | HR 59 | Temp 98.1°F | Resp 14 | Wt 160.8 lb

## 2017-10-24 DIAGNOSIS — R350 Frequency of micturition: Secondary | ICD-10-CM | POA: Diagnosis not present

## 2017-10-24 DIAGNOSIS — R358 Other polyuria: Secondary | ICD-10-CM

## 2017-10-24 DIAGNOSIS — R631 Polydipsia: Secondary | ICD-10-CM

## 2017-10-24 DIAGNOSIS — R3589 Other polyuria: Secondary | ICD-10-CM

## 2017-10-24 LAB — URINALYSIS, ROUTINE W REFLEX MICROSCOPIC
Bilirubin Urine: NEGATIVE
Glucose, UA: NEGATIVE
HGB URINE DIPSTICK: NEGATIVE
Ketones, ur: NEGATIVE
LEUKOCYTES UA: NEGATIVE
NITRITE: NEGATIVE
PH: 7 (ref 5.0–8.0)
PROTEIN: NEGATIVE
Specific Gravity, Urine: 1.01 (ref 1.001–1.03)

## 2017-10-24 LAB — GLUCOSE 16585: Glucose: 97 mg/dL (ref 65–99)

## 2017-10-24 NOTE — Progress Notes (Signed)
Patient ID: Brian Blake MRN: [id number], DOB: 1985-11-03, 32 y.o. Date of Encounter: @DATE @  Chief Complaint:  Chief Complaint  Patient presents with  . Urinary Frequency    x1 year    HPI: 32 y.o. year old male  presents with above.   Reports that for quite some time for probably about a year he had been noticing some issues with polyuria.  Said at first he mostly noticed it when he and his wife would go out of town with friends and it would be more of an issue/inconvenience --then it was more noticeable. At those times was very aware that he would need to urinate multiple times when trying to go to sleep.   Says that then he also tried to decrease his water intake etc. Says that he has stopped drinking any liquid at around 5 PM.  Says that recently he has been doing some evening classes so he is up until 11 PM.  Says during that time he will feel thirsty but he will also feel like he needs to urinate.  Says that he thinks he also has the symptoms during the day but does not pay attention to it as much during the day. Does think he has to take bathroom breaks more than his coworkers but it is not as obvious to him at those times during the day.   For example his work is about 20 minutes away from our office.  Says that he went to the bathroom right before he left work and as soon as he got here had to go to the bathroom again.  Says that at night during a 20-minute time of him trying to go to sleep he will go to the bathroom about 5 times before he falls asleep.  Says that he will keep feeling the urge that he needs to urinate but then may only have a small dribble of urine there.  Discussed that I would check a blood sugar.  Asked what he has last eaten and what time. 12:30--had  grilled chicken, rice, steamed vegetables. At around 3:00 ate a [employer] bar.  Has noticed no other additional associated symptoms.    No past medical history on file.   Home  Meds: Outpatient Medications Prior to Visit  Medication Sig Dispense Refill  . Omega-3 Fatty Acids (FISH OIL PO) Take 1 capsule by mouth daily.    Marland Kitchen ibuprofen (ADVIL,MOTRIN) 200 MG tablet Take 400 mg by mouth every 6 (six) hours as needed for fever.    Marland Kitchen amoxicillin (AMOXIL) 875 MG tablet Take 1 tablet (875 mg total) by mouth 2 (two) times daily. 14 tablet 0   No facility-administered medications prior to visit.     Allergies: No Known Allergies  Social History   Socioeconomic History  . Marital status: Single    Spouse name: Not on file  . Number of children: Not on file  . Years of education: Not on file  . Highest education level: Not on file  Social Needs  . Financial resource strain: Not on file  . Food insecurity - worry: Not on file  . Food insecurity - inability: Not on file  . Transportation needs - medical: Not on file  . Transportation needs - non-medical: Not on file  Occupational History  . Not on file  Tobacco Use  . Smoking status: Never Smoker  . Smokeless tobacco: Never Used  Substance and Sexual Activity  . Alcohol use: Yes  Comment: occ  . Drug use: No  . Sexual activity: Yes  Other Topics Concern  . Not on file  Social History Narrative  . Not on file    Family History  Problem Relation Age of Onset  . Heart disease Father   . Heart disease Paternal Grandfather        pacemaker  . Diabetes Paternal Grandfather      Review of Systems:  See HPI for pertinent ROS. All other ROS negative.    Physical Exam: Blood pressure 140/82, pulse (!) 59, temperature 98.1 F (36.7 C), temperature source Oral, resp. rate 14, weight 72.9 kg (160 lb 12.8 oz), SpO2 99 %., Body mass index is 22.75 kg/m. General: WNWD WM. Appears in no acute distress. Neck: Supple. No thyromegaly. No lymphadenopathy. Lungs: Clear bilaterally to auscultation without wheezes, rales, or rhonchi. Breathing is unlabored. Heart: RRR with S1 S2. No murmurs, rubs, or  gallops. Abdomen: Soft, non-tender, non-distended with normoactive bowel sounds. No hepatomegaly. No rebound/guarding. No obvious abdominal masses. Musculoskeletal:  Strength and tone normal for age. Extremities/Skin: Warm and dry.  Neuro: Alert and oriented X 3. Moves all extremities spontaneously. Gait is normal. CNII-XII grossly in tact. Psych:  Responds to questions appropriately with a normal affect.     ASSESSMENT AND PLAN:  32 y.o. year old male with   1. Frequent urination Glucose normal. Will check additional labs. If labs normal, will refer to Urology for further evaluation, work-up. - Urinalysis, Routine w reflex microscopic - Glucose, fingerstick (stat) - CBC with Differential/Platelet - COMPLETE METABOLIC PANEL WITH GFR - Hemoglobin A1c - Ambulatory referral to Urology - PSA - TSH  2. Polyuria Glucose normal. Will check additional labs. If labs normal, will refer to Urology for further evaluation, work-up. - Glucose, fingerstick (stat) - CBC with Differential/Platelet - COMPLETE METABOLIC PANEL WITH GFR - Hemoglobin A1c - Ambulatory referral to Urology - PSA - TSH  3. Polydipsia Glucose normal. Will check additional labs. If labs normal, will refer to Urology for further evaluation, work-up. - Glucose, fingerstick (stat) - TSH     Signed, [address]Jakiera Ehler Beth SegundoDixon, [place], [facility] 10/24/2017 4:50 PM

## 2017-10-25 LAB — COMPLETE METABOLIC PANEL WITH GFR
AG Ratio: 2.2 (calc) (ref 1.0–2.5)
ALT: 15 U/L (ref 9–46)
AST: 20 U/L (ref 10–40)
Albumin: 4.8 g/dL (ref 3.6–5.1)
Alkaline phosphatase (APISO): 52 U/L (ref 40–115)
BUN: 24 mg/dL (ref 7–25)
CO2: 28 mmol/L (ref 20–32)
Calcium: 9.3 mg/dL (ref 8.6–10.3)
Chloride: 104 mmol/L (ref 98–110)
Creat: 1.04 mg/dL (ref 0.60–1.35)
GFR, EST AFRICAN AMERICAN: 112 mL/min/{1.73_m2} (ref >=60)
GFR, Est Non African American: 97 mL/min/{1.73_m2} (ref >=60)
GLUCOSE: 95 mg/dL (ref 65–99)
Globulin: 2.2 g/dL (calc) (ref 1.9–3.7)
Potassium: 4.5 mmol/L (ref 3.5–5.3)
Sodium: 141 mmol/L (ref 135–146)
Total Bilirubin: 0.6 mg/dL (ref 0.2–1.2)
Total Protein: 7 g/dL (ref 6.1–8.1)

## 2017-10-25 LAB — CBC WITH DIFFERENTIAL/PLATELET
BASOS PCT: 0.4 %
Basophils Absolute: 21 cells/uL (ref 0–200)
EOS ABS: 58 {cells}/uL (ref 15–500)
Eosinophils Relative: 1.1 %
HCT: 45.9 % (ref 38.5–50.0)
Hemoglobin: 15.4 g/dL (ref 13.2–17.1)
Lymphs Abs: 1871 cells/uL (ref 850–3900)
MCH: 29.2 pg (ref 27.0–33.0)
MCHC: 33.6 g/dL (ref 32.0–36.0)
MCV: 87.1 fL (ref 80.0–100.0)
MONOS PCT: 9.9 %
MPV: 12.2 fL (ref 7.5–12.5)
NEUTROS ABS: 2825 {cells}/uL (ref 1500–7800)
Neutrophils Relative %: 53.3 %
PLATELETS: 175 10*3/uL (ref 140–400)
RBC: 5.27 10*6/uL (ref 4.20–5.80)
RDW: 12.2 % (ref 11.0–15.0)
Total Lymphocyte: 35.3 %
WBC: 5.3 10*3/uL (ref 3.8–10.8)
WBCMIX: 525 {cells}/uL (ref 200–950)

## 2017-10-25 LAB — HEMOGLOBIN A1C
EAG (MMOL/L): 5.8 (calc)
Hgb A1c MFr Bld: 5.3 % of total Hgb (ref <5.7)
Mean Plasma Glucose: 105 (calc)

## 2017-10-25 LAB — PSA: PSA: 0.6 ng/mL (ref <=4.0)

## 2017-10-25 LAB — TSH: TSH: 2.15 m[IU]/L (ref 0.40–4.50)

## 2017-11-30 DIAGNOSIS — R350 Frequency of micturition: Secondary | ICD-10-CM | POA: Diagnosis not present

## 2018-01-02 DIAGNOSIS — M62838 Other muscle spasm: Secondary | ICD-10-CM | POA: Diagnosis not present

## 2018-01-02 DIAGNOSIS — M6281 Muscle weakness (generalized): Secondary | ICD-10-CM | POA: Diagnosis not present

## 2018-01-02 DIAGNOSIS — R350 Frequency of micturition: Secondary | ICD-10-CM | POA: Diagnosis not present

## 2018-01-24 DIAGNOSIS — M6281 Muscle weakness (generalized): Secondary | ICD-10-CM | POA: Diagnosis not present

## 2018-01-24 DIAGNOSIS — M62838 Other muscle spasm: Secondary | ICD-10-CM | POA: Diagnosis not present

## 2018-02-15 DIAGNOSIS — M6281 Muscle weakness (generalized): Secondary | ICD-10-CM | POA: Diagnosis not present

## 2018-02-15 DIAGNOSIS — M62838 Other muscle spasm: Secondary | ICD-10-CM | POA: Diagnosis not present

## 2018-02-15 DIAGNOSIS — R350 Frequency of micturition: Secondary | ICD-10-CM | POA: Diagnosis not present

## 2018-03-07 DIAGNOSIS — R350 Frequency of micturition: Secondary | ICD-10-CM | POA: Diagnosis not present

## 2018-03-07 DIAGNOSIS — Z0000 Encounter for general adult medical examination without abnormal findings: Secondary | ICD-10-CM | POA: Diagnosis not present

## 2018-03-07 DIAGNOSIS — Z130 Encounter for screening for diseases of the blood and blood-forming organs and certain disorders involving the immune mechanism: Secondary | ICD-10-CM | POA: Diagnosis not present

## 2018-03-07 DIAGNOSIS — R42 Dizziness and giddiness: Secondary | ICD-10-CM | POA: Diagnosis not present

## 2018-03-07 DIAGNOSIS — Z13220 Encounter for screening for lipoid disorders: Secondary | ICD-10-CM | POA: Diagnosis not present

## 2018-03-07 DIAGNOSIS — Z13228 Encounter for screening for other metabolic disorders: Secondary | ICD-10-CM | POA: Diagnosis not present

## 2018-07-04 DIAGNOSIS — Z23 Encounter for immunization: Secondary | ICD-10-CM | POA: Diagnosis not present

## 2018-07-04 DIAGNOSIS — R350 Frequency of micturition: Secondary | ICD-10-CM | POA: Diagnosis not present

## 2018-07-04 DIAGNOSIS — Z3009 Encounter for other general counseling and advice on contraception: Secondary | ICD-10-CM | POA: Diagnosis not present

## 2018-08-25 DIAGNOSIS — Z302 Encounter for sterilization: Secondary | ICD-10-CM | POA: Diagnosis not present

## 2018-11-12 ENCOUNTER — Emergency Department (HOSPITAL_COMMUNITY): Payer: 59

## 2018-11-12 ENCOUNTER — Encounter (HOSPITAL_COMMUNITY): Payer: Self-pay | Admitting: Emergency Medicine

## 2018-11-12 ENCOUNTER — Emergency Department (HOSPITAL_COMMUNITY)
Admission: EM | Admit: 2018-11-12 | Discharge: 2018-11-12 | Disposition: A | Discharge status: Home / Self Care | Payer: 59 | Attending: Emergency Medicine | Admitting: Emergency Medicine

## 2018-11-12 ENCOUNTER — Other Ambulatory Visit: Payer: Self-pay

## 2018-11-12 DIAGNOSIS — Y999 Unspecified external cause status: Secondary | ICD-10-CM | POA: Diagnosis not present

## 2018-11-12 DIAGNOSIS — Y939 Activity, unspecified: Secondary | ICD-10-CM | POA: Insufficient documentation

## 2018-11-12 DIAGNOSIS — S0990XA Unspecified injury of head, initial encounter: Secondary | ICD-10-CM

## 2018-11-12 DIAGNOSIS — W11XXXA Fall on and from ladder, initial encounter: Secondary | ICD-10-CM | POA: Diagnosis not present

## 2018-11-12 DIAGNOSIS — S14109A Unspecified injury at unspecified level of cervical spinal cord, initial encounter: Secondary | ICD-10-CM | POA: Diagnosis not present

## 2018-11-12 DIAGNOSIS — S99911A Unspecified injury of right ankle, initial encounter: Secondary | ICD-10-CM | POA: Diagnosis not present

## 2018-11-12 DIAGNOSIS — Y929 Unspecified place or not applicable: Secondary | ICD-10-CM | POA: Insufficient documentation

## 2018-11-12 DIAGNOSIS — T07XXXA Unspecified multiple injuries, initial encounter: Secondary | ICD-10-CM

## 2018-11-12 DIAGNOSIS — W19XXXA Unspecified fall, initial encounter: Secondary | ICD-10-CM

## 2018-11-12 DIAGNOSIS — S82402A Unspecified fracture of shaft of left fibula, initial encounter for closed fracture: Secondary | ICD-10-CM | POA: Diagnosis not present

## 2018-11-12 DIAGNOSIS — S060X0A Concussion without loss of consciousness, initial encounter: Secondary | ICD-10-CM

## 2018-11-12 DIAGNOSIS — W12XXXA Fall on and from scaffolding, initial encounter: Secondary | ICD-10-CM

## 2018-11-12 DIAGNOSIS — S299XXA Unspecified injury of thorax, initial encounter: Secondary | ICD-10-CM | POA: Diagnosis not present

## 2018-11-12 DIAGNOSIS — S82401A Unspecified fracture of shaft of right fibula, initial encounter for closed fracture: Secondary | ICD-10-CM | POA: Diagnosis not present

## 2018-11-12 DIAGNOSIS — S0993XA Unspecified injury of face, initial encounter: Secondary | ICD-10-CM | POA: Diagnosis not present

## 2018-11-12 NOTE — ED Provider Notes (Signed)
[facility] EMERGENCY DEPARTMENT Provider Note   CSN: [id number] Arrival date & time: 11/12/18  1824    History   Chief Complaint Chief Complaint  Patient presents with   Fall    HPI Brian Blake is a 33 y.o. male.     The history is provided by the patient, medical records and the EMS personnel. No language interpreter was used.  Trauma Mechanism of injury: fall Injury location: leg, head/neck and face Injury location detail: neck and head, R cheek and L lower leg, R lower leg and R ankle Arrived directly from scene: yes   Fall:      Fall occurred: from a roof (roof height scaffold with ladder on top)      Height of fall: 12ft  Protective equipment:       None      Suspicion of alcohol use: no      Suspicion of drug use: no  EMS/PTA data:      Immobilization: C-collar  Current symptoms:      Associated symptoms:            Reports headache and neck pain.            Denies abdominal pain, back pain, chest pain, nausea and vomiting.    No past medical history on file.  There are no active problems to display for this patient.   History reviewed. No pertinent surgical history.      Home Medications    Prior to Admission medications   Not on File    Family History No family history on file.  Social History Social History   Tobacco Use   Smoking status: Not on file  Substance Use Topics   Alcohol use: Not on file   Drug use: Not on file     Allergies   Patient has no allergy information on record.   Review of Systems Review of Systems  Constitutional: Negative for chills, diaphoresis, fatigue and fever.  HENT: Negative for congestion.   Eyes: Negative for visual disturbance.  Respiratory: Negative for cough, chest tightness, shortness of breath and wheezing.   Cardiovascular: Negative for chest pain.  Gastrointestinal: Negative for abdominal pain, constipation, diarrhea, nausea and vomiting.  Genitourinary:  Negative for flank pain and frequency.  Musculoskeletal: Positive for neck pain. Negative for back pain and neck stiffness.  Skin: Positive for wound (abrasions). Negative for rash.  Neurological: Positive for headaches.  Psychiatric/Behavioral: Negative for agitation.  All other systems reviewed and are negative.    Physical Exam Updated Vital Signs There were no vitals taken for this visit.  Physical Exam Vitals signs and nursing note reviewed.  Constitutional:      General: He is not in acute distress.    Appearance: He is well-developed. He is not ill-appearing, toxic-appearing or diaphoretic.  HENT:     Head: Normocephalic and atraumatic.     Nose: Nose normal. No congestion or rhinorrhea.     Mouth/Throat:     Pharynx: No oropharyngeal exudate or posterior oropharyngeal erythema.  Eyes:     Conjunctiva/sclera: Conjunctivae normal.     Pupils: Pupils are equal, round, and reactive to light.  Neck:     Musculoskeletal: Neck supple. No muscular tenderness.  Cardiovascular:     Rate and Rhythm: Normal rate and regular rhythm.     Pulses: Normal pulses.     Heart sounds: No murmur.  Pulmonary:     Effort: Pulmonary effort is normal. No  respiratory distress.     Breath sounds: Normal breath sounds. No wheezing, rhonchi or rales.  Chest:     Chest wall: No tenderness.  Abdominal:     General: There is no distension.     Palpations: Abdomen is soft.     Tenderness: There is no abdominal tenderness. There is no right CVA tenderness or left CVA tenderness.  Musculoskeletal:        General: Tenderness and signs of injury present.     Right ankle: Tenderness. Medial malleolus tenderness found.     Right lower leg: He exhibits tenderness. No edema.     Left lower leg: He exhibits tenderness. No edema.  Skin:    General: Skin is warm and dry.     Capillary Refill: Capillary refill takes less than 2 seconds.  Neurological:     General: No focal deficit present.     Mental  Status: He is alert.  Psychiatric:        Mood and Affect: Mood normal.      ED Treatments / Results  Labs (all labs ordered are listed, but only abnormal results are displayed) Labs Reviewed - No data to display  EKG None  Radiology Dg Chest 2 View  Result Date: 11/12/2018 CLINICAL DATA:  Pain after fall. EXAM: CHEST - 2 VIEW COMPARISON:  None. FINDINGS: The heart size and mediastinal contours are within normal limits. Both lungs are clear. The visualized skeletal structures are unremarkable. IMPRESSION: No active cardiopulmonary disease. Electronically Signed   By: Gerome Sam III M.D   On: 11/12/2018 20:25   Dg Tibia/fibula Left  Result Date: 11/12/2018 CLINICAL DATA:  Initial evaluation for acute trauma, fall. EXAM: LEFT TIBIA AND FIBULA - 2 VIEW COMPARISON:  None. FINDINGS: There is no evidence of fracture or other focal bone lesions. Soft tissues are unremarkable. IMPRESSION: Negative. Electronically Signed   By: Rise Mu M.D.   On: 11/12/2018 19:17   Dg Tibia/fibula Right  Result Date: 11/12/2018 CLINICAL DATA:  33 year old male with fall from a ladder and trauma to the right lower extremity. EXAM: RIGHT ANKLE - COMPLETE 3+ VIEW; RIGHT TIBIA AND FIBULA - 2 VIEW COMPARISON:  None. FINDINGS: There is no evidence of fracture, dislocation, or joint effusion. There is no evidence of arthropathy or other focal bone abnormality. Soft tissues are unremarkable. IMPRESSION: Negative. Electronically Signed   By: Elgie Collard M.D.   On: 11/12/2018 19:18   Dg Ankle Complete Right  Result Date: 11/12/2018 CLINICAL DATA:  33 year old male with fall from a ladder and trauma to the right lower extremity. EXAM: RIGHT ANKLE - COMPLETE 3+ VIEW; RIGHT TIBIA AND FIBULA - 2 VIEW COMPARISON:  None. FINDINGS: There is no evidence of fracture, dislocation, or joint effusion. There is no evidence of arthropathy or other focal bone abnormality. Soft tissues are unremarkable. IMPRESSION:  Negative. Electronically Signed   By: Elgie Collard M.D.   On: 11/12/2018 19:18   Ct Head Wo Contrast  Result Date: 11/12/2018 CLINICAL DATA:  Pain after fall EXAM: CT HEAD WITHOUT CONTRAST CT MAXILLOFACIAL WITHOUT CONTRAST CT CERVICAL SPINE WITHOUT CONTRAST TECHNIQUE: Multidetector CT imaging of the head, cervical spine, and maxillofacial structures were performed using the standard protocol without intravenous contrast. Multiplanar CT image reconstructions of the cervical spine and maxillofacial structures were also generated. COMPARISON:  None. FINDINGS: CT HEAD FINDINGS Brain: No evidence of acute infarction, hemorrhage, hydrocephalus, extra-axial collection or mass lesion/mass effect. Vascular: No hyperdense vessel or unexpected calcification. Skull: Normal.  Negative for fracture or focal lesion. Other: None. CT MAXILLOFACIAL FINDINGS Osseous: No fracture or mandibular dislocation. No destructive process. Orbits: Negative. No traumatic or inflammatory finding. Sinuses: Clear. Soft tissues: Negative. CT CERVICAL SPINE FINDINGS Alignment: Normal. Skull base and vertebrae: No acute fracture. No primary bone lesion or focal pathologic process. Soft tissues and spinal canal: No prevertebral fluid or swelling. No visible canal hematoma. Disc levels:  No significant degenerative changes. Upper chest: No other abnormalities. Other: No other abnormalities. IMPRESSION: 1. No acute intracranial abnormalities. 2. No facial bone fractures. 3. No cervical spine fracture or traumatic malalignment. Electronically Signed   By: Gerome Sam III M.D   On: 11/12/2018 19:46   Ct Cervical Spine Wo Contrast  Result Date: 11/12/2018 CLINICAL DATA:  Pain after fall EXAM: CT HEAD WITHOUT CONTRAST CT MAXILLOFACIAL WITHOUT CONTRAST CT CERVICAL SPINE WITHOUT CONTRAST TECHNIQUE: Multidetector CT imaging of the head, cervical spine, and maxillofacial structures were performed using the standard protocol without intravenous  contrast. Multiplanar CT image reconstructions of the cervical spine and maxillofacial structures were also generated. COMPARISON:  None. FINDINGS: CT HEAD FINDINGS Brain: No evidence of acute infarction, hemorrhage, hydrocephalus, extra-axial collection or mass lesion/mass effect. Vascular: No hyperdense vessel or unexpected calcification. Skull: Normal. Negative for fracture or focal lesion. Other: None. CT MAXILLOFACIAL FINDINGS Osseous: No fracture or mandibular dislocation. No destructive process. Orbits: Negative. No traumatic or inflammatory finding. Sinuses: Clear. Soft tissues: Negative. CT CERVICAL SPINE FINDINGS Alignment: Normal. Skull base and vertebrae: No acute fracture. No primary bone lesion or focal pathologic process. Soft tissues and spinal canal: No prevertebral fluid or swelling. No visible canal hematoma. Disc levels:  No significant degenerative changes. Upper chest: No other abnormalities. Other: No other abnormalities. IMPRESSION: 1. No acute intracranial abnormalities. 2. No facial bone fractures. 3. No cervical spine fracture or traumatic malalignment. Electronically Signed   By: Gerome Sam III M.D   On: 11/12/2018 19:46   Ct Maxillofacial Wo Contrast  Result Date: 11/12/2018 CLINICAL DATA:  Pain after fall EXAM: CT HEAD WITHOUT CONTRAST CT MAXILLOFACIAL WITHOUT CONTRAST CT CERVICAL SPINE WITHOUT CONTRAST TECHNIQUE: Multidetector CT imaging of the head, cervical spine, and maxillofacial structures were performed using the standard protocol without intravenous contrast. Multiplanar CT image reconstructions of the cervical spine and maxillofacial structures were also generated. COMPARISON:  None. FINDINGS: CT HEAD FINDINGS Brain: No evidence of acute infarction, hemorrhage, hydrocephalus, extra-axial collection or mass lesion/mass effect. Vascular: No hyperdense vessel or unexpected calcification. Skull: Normal. Negative for fracture or focal lesion. Other: None. CT MAXILLOFACIAL  FINDINGS Osseous: No fracture or mandibular dislocation. No destructive process. Orbits: Negative. No traumatic or inflammatory finding. Sinuses: Clear. Soft tissues: Negative. CT CERVICAL SPINE FINDINGS Alignment: Normal. Skull base and vertebrae: No acute fracture. No primary bone lesion or focal pathologic process. Soft tissues and spinal canal: No prevertebral fluid or swelling. No visible canal hematoma. Disc levels:  No significant degenerative changes. Upper chest: No other abnormalities. Other: No other abnormalities. IMPRESSION: 1. No acute intracranial abnormalities. 2. No facial bone fractures. 3. No cervical spine fracture or traumatic malalignment. Electronically Signed   By: Gerome Sam III M.D   On: 11/12/2018 19:46    Procedures Procedures (including critical care time)  Medications Ordered in ED Medications - No data to display   Initial Impression / Assessment and Plan / ED Course  I have reviewed the triage vital signs and the nursing notes.  Pertinent labs & imaging results that were available during my care of  the patient were reviewed by me and considered in my medical decision making (see chart for details).        Brian Blake is a 33 y.o. male with no significant past medical history who presents as a level 2 trauma for fall from height.  Patient reports that he was on a 6 foot scaffolding and then was on a 17 foot ladder trying to plug holes in a family members' roof to keep birds out when he fell.  He does not remember the fall.  He reports pain in his head, neck, and his right face.  He reports pain in both of his shins and his right ankle.  He reports his tetanus is up-to-date.  He denies any chest pain, abdominal pain, or back pain.  Denies any pelvis pain.  Denies any pain in his knees or hips.  He reports the pain is mild to moderate.  Initially, patient was level because he was slightly disoriented and confused however this has resolved during  transport.  On exam, patient had bruising on his right face.  No blood in his ears.  No blood in his oropharynx or nasopharynx.  Normal extraocular movements and pupil exam.  No significant tenderness on his neck but he was having some neck pain.  No tenderness or laceration seen on the head.  Normal sensation and strength in all extremities.  Normal pulses in extremities.  Lungs clear chest is nontender.  Back is nontender, abdomen nontender.  Patient has abrasions and tenderness on his anterior shins bilaterally and his right medial ankle.  Patient otherwise appears well.  Shared decision made conversation held with patient we decided to get CTs of the head, face, and neck as well as x-rays of the bilateral tib/fib area and right ankle.  Given his lack of other symptoms, will hold on labs or other imaging at this time.  He had a completely reassuring lung exam and had no symptoms in his torso.  Will hold on x-rays or CTs currently.  Anticipate reassessment for work-up.  Suspect patient has a concussion.  Patient's CTs of the head, neck, face, and x-rays of both legs were reassuring.  No fracture dislocation.  Suspect soft tissue injury and concussion.  Patient did report he was now having a mild shortness of breath although no severe chest pain.  X-ray will be obtained.  Patient's x-ray of the chest showed no rib fractures or other abnormality.  No pneumothorax.  Vital signs reassuring on reassessment he is feeling much better.  He would like to go home.  Patient given instructions on concussion management and PCP follow-up.  Suspect soft tissue injury otherwise.  Patient agreed with plan of care and was discharged in good condition.   Final Clinical Impressions(s) / ED Diagnoses   Final diagnoses:  Fall, initial encounter  Injury of head, initial encounter  Concussion without loss of consciousness, initial encounter  Abrasions of multiple sites  Fall from ladder, initial encounter  Fall  from scaffold, initial encounter    ED Discharge Orders    None      Clinical Impression: 1. Fall, initial encounter   2. Injury of head, initial encounter   3. Concussion without loss of consciousness, initial encounter   4. Abrasions of multiple sites   5. Fall from ladder, initial encounter   6. Fall from scaffold, initial encounter     Disposition: Discharge  Condition: Good  I have discussed the results, Dx and Tx plan with  the pt(& family if present). He/she/they expressed understanding and agree(s) with the plan. Discharge instructions discussed at great length. Strict return precautions discussed and pt &/or family have verbalized understanding of the instructions. No further questions at time of discharge.    New Prescriptions   No medications on file    Follow Up: Dorena Bodo, PA-C     [facility] EMERGENCY DEPARTMENT [address] [id number] mc [place] [address] [phone]       Richie Bonanno, Canary Brim, MD 11/12/18 2124

## 2018-11-12 NOTE — ED Notes (Signed)
Pt to xray

## 2018-11-12 NOTE — Progress Notes (Signed)
   11/12/18 1800  Clinical Encounter Type  Visited With Patient  Visit Type Initial;Spiritual support;Trauma  Referral From Nurse  Consult/Referral To Chaplain  Spiritual Encounters  Spiritual Needs Emotional;Prayer   Chaplain responded to Level 2 page to ED. PT was alert and talking. PT requested that Nurse give him the opportunity to call his wife. I offered spiritual care with words of encouragement, ministry of presence and prayer. Pt was very thankful for Chaplain presence.  Chaplain Orest Dikes [phone]

## 2018-11-12 NOTE — ED Triage Notes (Signed)
Pt fall from ladder that was on top of scaffolding on a hill- approx 20 feet per ems- had memory impairment at scene, on arrival is alert/oriented x 3- w/d- speech is cleear-- has multiple abrasions to shoulders and shins. Slight sweelling to right eye and temporal area.

## 2018-11-12 NOTE — Progress Notes (Signed)
Orthopedic Tech Progress Note Patient Details:  Brian Blake May 13, 1986 [id number] Level 2 trauma Patient ID: [name], male   DOB: 16-May-1986, 33 y.o.   MRN: [id number]   [name] 11/12/2018, 6:32 PM

## 2018-11-12 NOTE — Progress Notes (Signed)
RT responded to level 2 trauma in ED Trauma C. Pt 100% on RA. No distress, no increased WOB, Pt denies SOB. VS within normal limits. RT not needed at this time but will continue to monitor

## 2018-11-12 NOTE — Discharge Instructions (Addendum)
Your imaging was all reassuring with no significant fractures, dislocations, or intracranial hemorrhage.  You are extremely lucky with lack of significant injury from the fall however I suspect you have a concussion.  Please observe the concussion recommendations and follow-up with your primary doctor.  Please rest and stay hydrated.  If any symptoms change or worsen, please return to the nearest emergency department.

## 2018-11-12 NOTE — ED Notes (Signed)
Pt tolerating fluid and crackers, ambulated in room without difficulty

## 2018-11-12 NOTE — ED Notes (Signed)
Family updated as to patient's status. Pt speaking with wife on phone -- Wife's number [phone]  [facility]

## 2018-11-13 ENCOUNTER — Encounter: Payer: Self-pay | Admitting: Physician Assistant

## 2018-11-16 ENCOUNTER — Encounter: Payer: Self-pay | Admitting: Family Medicine

## 2018-11-16 ENCOUNTER — Ambulatory Visit: Payer: 59 | Admitting: Family Medicine

## 2018-11-16 ENCOUNTER — Other Ambulatory Visit: Payer: Self-pay

## 2018-11-16 VITALS — BP 110/62 | HR 65 | Temp 97.5°F | Resp 18 | Ht 70.0 in | Wt 159.4 lb

## 2018-11-16 DIAGNOSIS — R51 Headache: Secondary | ICD-10-CM

## 2018-11-16 DIAGNOSIS — R519 Headache, unspecified: Secondary | ICD-10-CM

## 2018-11-16 DIAGNOSIS — S161XXA Strain of muscle, fascia and tendon at neck level, initial encounter: Secondary | ICD-10-CM | POA: Diagnosis not present

## 2018-11-16 DIAGNOSIS — Z09 Encounter for follow-up examination after completed treatment for conditions other than malignant neoplasm: Secondary | ICD-10-CM

## 2018-11-16 DIAGNOSIS — M25532 Pain in left wrist: Secondary | ICD-10-CM

## 2018-11-16 DIAGNOSIS — S060X1D Concussion with loss of consciousness of 30 minutes or less, subsequent encounter: Secondary | ICD-10-CM | POA: Diagnosis not present

## 2018-11-16 DIAGNOSIS — M25531 Pain in right wrist: Secondary | ICD-10-CM

## 2018-11-16 MED ORDER — CYCLOBENZAPRINE HCL 5 MG PO TABS: 5.0000 mg | ORAL_TABLET | Freq: Three times a day (TID) | ORAL | 0 refills | Status: DC | PRN

## 2018-11-16 MED ORDER — IBUPROFEN 600 MG PO TABS: 600.0000 mg | ORAL_TABLET | Freq: Three times a day (TID) | ORAL | 0 refills | Status: DC | PRN

## 2018-11-16 NOTE — Patient Instructions (Addendum)
Cervical Sprain  A cervical sprain is a stretch or tear in the tissues that connect bones (ligaments) in the neck. Most neck (cervical) sprains get better in 4-6 weeks. Follow these instructions at home: If you have a neck collar:  Wear it as told by your doctor. Do not take off (do not remove) the collar unless your doctor says that this is safe.  Ask your doctor before adjusting your collar.  If you have long hair, keep it outside of the collar.  Ask your doctor if you may take off the collar for cleaning and bathing. If you may take off the collar: ? Follow instructions from your doctor about how to take off the collar safely. ? Clean the collar by wiping it with mild soap and water. Let it air-dry all the way. ? If your collar has removable pads:  Take the pads out every 1-2 days.  Hand wash the pads with soap and water.  Let the pads air-dry all the way before you put them back in the collar. Do not dry them in a clothes dryer. Do not dry them with a hair dryer. ? Check your skin under the collar for irritation or sores. If you see any, tell your doctor. Managing pain, stiffness, and swelling   Use a cervical traction device, if told by your doctor.  If told, put heat on the affected area. Do this before exercises (physical therapy) or as often as told by your doctor. Use the heat source that your doctor recommends, such as a moist heat pack or a heating pad. ? Place a towel between your skin and the heat source. ? Leave the heat on for 20-30 minutes. ? Take the heat off (remove the heat) if your skin turns bright red. This is very important if you cannot feel pain, heat, or cold. You may have a greater risk of getting burned.  Put ice on the affected area. ? Put ice in a plastic bag. ? Place a towel between your skin and the bag. ? Leave the ice on for 20 minutes, 2-3 times a day. Activity  Do not drive while wearing a neck collar. If you do not have a neck collar, ask  your doctor if it is safe to drive.  Do not drive or use heavy machinery while taking prescription pain medicine or muscle relaxants, unless your doctor approves.  Do not lift anything that is heavier than 10 lb (4.5 kg) until your doctor tells you that it is safe.  Rest as told by your doctor.  Avoid activities that make you feel worse. Ask your doctor what activities are safe for you.  Do exercises as told by your doctor or physical therapist. Preventing neck sprain  Practice good posture. Adjust your workstation to help with this, if needed.  Exercise regularly as told by your doctor or physical therapist.  Avoid activities that are risky or may cause a neck sprain (cervical sprain). General instructions  Take over-the-counter and prescription medicines only as told by your doctor.  Do not use any products that contain nicotine or tobacco. This includes cigarettes and e-cigarettes. If you need help quitting, ask your doctor.  Keep all follow-up visits as told by your doctor. This is important. Contact a doctor if:  You have pain or other symptoms that get worse.  You have symptoms that do not get better after 2 weeks.  You have pain that does not get better with medicine.  You start to   have new, unexplained symptoms.  You have sores or irritated skin from wearing your neck collar. Get help right away if:  You have very bad pain.  You have any of the following in any part of your body: ? Loss of feeling (numbness). ? Tingling. ? Weakness.  You cannot move a part of your body (you have paralysis).  Your activity level does not improve. Summary  A cervical sprain is a stretch or tear in the tissues that connect bones (ligaments) in the neck.  If you have a neck (cervical) collar, do not take off the collar unless your doctor says that this is safe.  Put ice on affected areas as told by your doctor.  Put heat on affected areas as told by your doctor.  Good  posture and regular exercise can help prevent a neck sprain from happening again. This information is not intended to replace advice given to you by your health care provider. Make sure you discuss any questions you have with your health care provider. Document Released: 01/19/2008 Document Revised: 04/13/2016 Document Reviewed: 04/13/2016 Elsevier Interactive Patient Education  2019 [employer]   [employer] Therapy Heat therapy can help ease sore, stiff, injured, and tight muscles and joints. Heat relaxes your muscles, which may help ease your pain and muscle spasms. Do not use heat therapy unless your doctor tells you to use it. How to use heat therapy There are several different kinds of heat therapy, including:  Moist heat pack.  Hot water bottle.  Electric heating pad.  Heated gel pack.  Heated wrap.  Warm water bath. Your doctor will tell you how to use heat therapy. In general, you should: 1. Place a towel between your skin and the heat source. 2. Leave the heat on for 20-30 minutes. Your skin may turn pink. 3. Remove the heat if your skin turns bright red. You should remove the heat source if you are unable to feel pain, heat, or cold. You are more likely to get burned if you leave it on the skin for too long. Your doctor may also tell you to take a warm water bath. To do this: 1. Put a non-slip pad in the bathtub to prevent a fall. 2. Fill the bathtub with warm water. 3. Check the water temperature. 4. Soak in the water for 15-20 minutes, or as told by your doctor. 5. Be careful when you stand up after the bath. You may feel dizzy. 6. Pat yourself dry after the bath. Do not rub your skin to dry it. General recommendations for heat therapy  Do not sleep while using heat therapy. Only use heat therapy while you are awake.  Your skin may turn pink while using heat therapy. Do not use heat therapy if your skin turns red.  Do not use heat therapy if you have a new injury.   High heat or using heat for a long time can cause burns. Be careful not to burn your skin when using heat therapy.  Do not use heat therapy on areas of your skin that are already irritated, such as with a rash or sunburn. Get help if you have:  Blisters, redness, swelling (puffiness), or numbness.  New pain.  Pain that is getting worse. Summary  Heat therapy is the use of heat to help ease sore, stiff, injured, and tight muscles and joints.  There are different types of heat therapy. Your doctor will tell you which one to use.  Only use heat therapy  while you are awake.  Watch your skin to make sure you do not get burned while using heat therapy. This information is not intended to replace advice given to you by your health care provider. Make sure you discuss any questions you have with your health care provider. Document Released: 10/25/2011 Document Revised: 08/13/2017 Document Reviewed: 08/13/2017 Elsevier Interactive Patient Education  2019 [employer].

## 2018-11-16 NOTE — Progress Notes (Signed)
Patient ID: Brian Blake, male    DOB: May 02, 1986, 33 y.o.   MRN: [id number]  PCP: Dorena Bodo, PA-C  Chief Complaint  Patient presents with  . Neck Pain    from fall on 03/29 about 20 ft. Went to hospital and they did scans, tylenol was taken    Subjective:   Brian Blake is a 33 y.o. male, presents to clinic with CC of neck pain s/p fall, gradual onset of pain and stiffness.   Also here to follow up from ER visit s/p fall from 25 feet with LOC: Er visit, images, results, and discharge summary reviewed from visit on 11/12/2018 Dx with  Fall, initial encounter  Injury of head, initial encounter  Concussion without loss of consciousness, initial encounter  Abrasions of multiple sites  Fall from ladder, initial encounter  Fall from scaffold, initial encounter   Negative CT of head, cervical spine, max face, Xrays of b/l tib/fib, CXR.  He presents here 4 days after fall with neck pain and stiffness, has some other aches and pains - points to right ribs, and he has a mild right black eye, but his neck muslce pain and stiffness is gradually worsening, he was concerned and wanted to be evaluated, he's worried that he may need surgery?  Right muscles most tender and stiff, worse with movement or palpation. No HA right now, no numbness, tingling, dizziness, nausea, photophobia, confusion, difficulty sleeping.   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  He states that his wrist pain although it is intermittent and alternates between both wrist it is more severe on the left side, neither side has had any swelling or redness, no new strenuous or repetitive activity that he notes today, states that he will do thing minor like holding something  in his left hand and try to supinate and he has sharp severe pain causing him to drop things not any weakness though.    When reviewing symptoms for close head injury and concussion patient also mentions that for the past year or so he is having intermittent but very severe sudden onset sharp and pressure type headaches that are very focal last for a few seconds and go away without any associated symptoms.  He never had headaches before, there is no activity that he has noted brings it on his not correlated it with any other causes like lack of sleep, [place], dehydration.  When headaches come he has no associated symptoms no lacrimation, nasal drainage, weakness, numbness, near syncope.  He asks if a CT scan of his brain from the ER would have picked up anything bad like cancer that would be causing his headaches.  He says the pain is mild to moderate, does not affect him long-term, he does not even take medicine for when that occurs, but it is new and concerning to him to be 1 to ask about it today.   There are no active problems to display for this patient.    Prior to Admission medications   Medication Sig Start Date End Date Taking? Authorizing Provider  ibuprofen (ADVIL,MOTRIN) 200 MG tablet Take 400 mg by mouth every 6 (six) hours as needed for fever.  Yes [provider]  Omega-3 Fatty Acids (FISH OIL PO) Take 1 capsule by mouth daily.   Yes [provider]     No Known Allergies   Family History  Problem Relation Age of Onset  . Heart disease Father   . Heart disease Paternal Grandfather        pacemaker  . Diabetes Paternal Grandfather      Social History   Socioeconomic History  . Marital status: Single    Spouse name: Not on file  . Number of children: Not on file  . Years of education: Not on file  . Highest education level: Not on file  Occupational History  . Not on file  Social Needs  . Financial resource strain: Not on file  . Food  insecurity:    Worry: Not on file    Inability: Not on file  . Transportation needs:    Medical: Not on file    Non-medical: Not on file  Tobacco Use  . Smoking status: Never Smoker  Substance and Sexual Activity  . Alcohol use: Yes    Comment: occ  . Drug use: No  . Sexual activity: Yes  Lifestyle  . Physical activity:    Days per week: Not on file    Minutes per session: Not on file  . Stress: Not on file  Relationships  . Social connections:    Talks on phone: Not on file    Gets together: Not on file    Attends religious service: Not on file    Active member of club or organization: Not on file    Attends meetings of clubs or organizations: Not on file    Relationship status: Not on file  . Intimate partner violence:    Fear of current or ex partner: Not on file    Emotionally abused: Not on file    Physically abused: Not on file    Forced sexual activity: Not on file  Other Topics Concern  . Not on file  Social History Narrative   ** Merged History Encounter **         Review of Systems  Constitutional: Negative.   HENT: Negative.   Eyes: Negative.   Respiratory: Negative.   Cardiovascular: Negative.   Gastrointestinal: Negative.  Negative for constipation, nausea and vomiting.  Endocrine: Negative.   Genitourinary: Negative.   Musculoskeletal: Positive for neck pain and neck stiffness. Negative for arthralgias and joint swelling.  Skin: Negative.  Negative for color change.  Allergic/Immunologic: Negative.   Neurological: Positive for headaches. Negative for dizziness, tremors, seizures, syncope, facial asymmetry, speech difficulty, weakness, light-headedness and numbness.  Hematological: Negative.  Negative for adenopathy. Does not bruise/bleed easily.  Psychiatric/Behavioral: Positive for confusion and dysphoric mood. Negative for decreased concentration, sleep disturbance and suicidal ideas.  All other systems reviewed and are negative.       Objective:    Vitals:   11/16/18 1213  BP: 110/62  Pulse: 65  Resp: 18  Temp: (!) 97.5 F (36.4 C)  SpO2: 97%  Weight: 159 lb 6.4 oz (72.3 kg)  Height:  (1.778 m)      Physical Exam Vitals signs and nursing note reviewed.  Constitutional:      General: He is not in acute distress.    Appearance: Normal appearance. He is well-developed and normal weight. He is not ill-appearing, toxic-appearing or diaphoretic.  HENT:     Head: Normocephalic and atraumatic.     Jaw: There  is normal jaw occlusion.     Right Ear: External ear normal.     Left Ear: External ear normal.     Nose: Nose normal. No congestion or rhinorrhea.     Mouth/Throat:     Mouth: Mucous membranes are moist.     Pharynx: Oropharynx is clear.  Eyes:     General:        Right eye: No discharge.        Left eye: No discharge.     Extraocular Movements: Extraocular movements intact.     Conjunctiva/sclera: Conjunctivae normal.     Pupils: Pupils are equal, round, and reactive to light.     Comments: Right eye some mild bruising, no raccoon sign  Neck:     Musculoskeletal: Muscular tenderness present. No edema, neck rigidity, injury or spinous process tenderness.     Trachea: Trachea and phonation normal. No tracheal deviation.      Comments: Full aROM, pt moves slowly and carefully, lateral rotation b/l >45 degrees Cardiovascular:     Rate and Rhythm: Normal rate and regular rhythm.     Pulses: Normal pulses.     Heart sounds: Normal heart sounds.  Pulmonary:     Effort: Pulmonary effort is normal. No respiratory distress.     Breath sounds: Normal breath sounds. No stridor.  Abdominal:     General: Abdomen is flat. Bowel sounds are normal.  Musculoskeletal: Normal range of motion.     Right wrist: He exhibits normal range of motion, no tenderness, no bony tenderness, no swelling, no effusion, no crepitus and no deformity.     Left wrist: He exhibits normal range of motion, no tenderness, no bony  tenderness, no swelling, no effusion and no crepitus.     Thoracic back: Normal.     Lumbar back: Normal.     Right hand: Normal. He exhibits normal range of motion, no tenderness, no bony tenderness, normal capillary refill, no deformity, no laceration and no swelling. Normal sensation noted.     Left hand: Normal. He exhibits normal range of motion, no tenderness, no bony tenderness, normal capillary refill, no deformity, no laceration and no swelling. Normal sensation noted.  Skin:    General: Skin is warm and dry.     Findings: No rash.  Neurological:     General: No focal deficit present.     Mental Status: He is alert.     Cranial Nerves: No cranial nerve deficit.     Sensory: Sensation is intact. No sensory deficit.     Motor: No weakness, tremor, abnormal muscle tone or seizure activity.     Coordination: Coordination normal.     Gait: Gait normal.     Comments: 5/5 grip strength bilaterally  Psychiatric:        Mood and Affect: Mood normal.        Behavior: Behavior normal.           Assessment & Plan:      ICD-10-CM   1. Acute strain of neck muscle, initial encounter S16.1XXA Ambulatory referral to Physical Therapy    cyclobenzaprine (FLEXERIL) 5 MG tablet    ibuprofen (ADVIL,MOTRIN) 600 MG tablet   rest, heat tx, NSAIDs (but cautioned to avoid [facility]), PT referral if able to help rehab after fall from height  2. Closed head injury with concussion, with loss of consciousness of 30 minutes or less, subsequent encounter S06.0X1D Ambulatory referral to Neurology   no HA's, advised to decrease work  hours or activity if he develops, brain rest/gradual return to activities reviewed and advised  3. Nonintractable episodic headache, unspecified headache type R51 Ambulatory referral to Neurology   intermittent headaches, moderate pain, various locations but usually focal - pressure/sharp, no other sx, pt wants neuro referral  4. Pain in both wrists M25.531 Ambulatory referral to  Hand Surgery   M25.532 ibuprofen (ADVIL,MOTRIN) 600 MG tablet   intermittent sx, exam benign today, may be mild overuse vs carpal tunnel?  refer to hand specialist per pt request    Hand outs given for cervical strain and heat therapy, work note given for avoiding lifting overhead, avoiding any strenuous activity and avoiding anything where he depends on upper body strength for 3-7 days, pt instructed to return next week for reevaluation so he could be cleared if employer required.    ER and f/up precautions reviewed and pt verbalized understanding.   Danelle Berry, PA-C 11/16/18 12:59 PM

## 2019-03-16 ENCOUNTER — Other Ambulatory Visit: Payer: Self-pay

## 2019-03-16 DIAGNOSIS — Z20822 Contact with and (suspected) exposure to covid-19: Secondary | ICD-10-CM

## 2019-03-18 LAB — NOVEL CORONAVIRUS, NAA: SARS-CoV-2, NAA: NOT DETECTED

## 2020-03-05 NOTE — Progress Notes (Signed)
Cardiology Office Note:    Date:  03/07/2020   ID:  Brian Blake, DOB 05/30/1986, MRN [id number]  PCP:  Danelle Berryapia, Leisa, PA-C  Cardiologist:  No primary care provider on file.  Electrophysiologist:  None   Referring MD: Adrienne MochaQuinn, Kiera A, PA   Chief Complaint  Patient presents with  . Dizziness   History of Present Illness:    Brian MeekerBlake Patrick Aaberg is a 34 y.o. male with no significant past medical history who is referred by Roslynn AmbleKiera Quinn, PA for evaluation of bradycardia and dizziness.  He reports that he had an episode of lightheadedness recently.  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  He rested and ate and felt somewhat better.  However when he started to work again he said he felt like he was going to pass out.  Felt short of breath during episode.  Denied any chest pain.  Checked his glucose and it was normal.  Went to [facility] to check BP, and was told by the pharmacist that his BP was very high and he should go to the ED.  He was feeling better at that point so did not go to the ED, but stopped by the fire department to check BP and was told it was slightly elevated.  He reports he felt back to normal at that point.  EKG was done at the fire department he was told it was normal.  He did report that he was having a headache.  States that he has not had any episodes of lightheadedness since that time.  Denies palpitations or syncope.  Does report he has been having chest tightness over the last few months.  Describes tightness on left side of chest.  Occurs when he is stressed.  Has not noted relationship with exertion.  Reports he has not been exercising.  No smoking history.  Family history includes paternal grandfather had MI in 3850s, developed CHF and underwent heart transplant.    No past medical history on file.  Past Surgical History:  Procedure Laterality Date  . APPENDECTOMY      Current Medications: No  outpatient medications have been marked as taking for the 03/06/20 encounter (Office Visit) with Little IshikawaSchumann, Ka Flammer L, MD.     Allergies:   Patient has no known allergies.   Social History   Socioeconomic History  . Marital status: Married    Spouse name: Not on file  . Number of children: Not on file  . Years of education: Not on file  . Highest education level: Not on file  Occupational History  . Not on file  Tobacco Use  . Smoking status: Never Smoker  . Smokeless tobacco: Never Used  Vaping Use  . Vaping Use: Never assessed  Substance and Sexual Activity  . Alcohol use: Yes    Comment: occ  . Drug use: No  . Sexual activity: Yes  Other Topics Concern  . Not on file  Social History Narrative   ** Merged History Encounter **       Social Determinants of Health   Financial Resource Strain:   . Difficulty of Paying Living Expenses:   Food Insecurity:   . Worried About Programme researcher, broadcasting/film/videounning Out of Food in the Last Year:   . Baristaan Out of Food in the Last Year:   Transportation Needs:   . Freight forwarderLack of Transportation (Medical):   Marland Kitchen. Lack of Transportation (Non-Medical):   Physical  Activity:   . Days of Exercise per Week:   . Minutes of Exercise per Session:   Stress:   . Feeling of Stress :   Social Connections:   . Frequency of Communication with Friends and Family:   . Frequency of Social Gatherings with Friends and Family:   . Attends Religious Services:   . Active Member of Clubs or Organizations:   . Attends Banker Meetings:   Marland Kitchen Marital Status:      Family History: The patient's family history includes Diabetes in his paternal grandfather; Heart disease in his father and paternal grandfather.  ROS:   Please see the history of present illness.     All other systems reviewed and are negative.  EKGs/Labs/Other Studies Reviewed:    The following studies were reviewed today:   EKG:  EKG is  ordered today.  The ekg ordered today demonstrates normal sinus  rhythm, rate 60, sinus arrhythmia, no ST/T abnormalities  Recent Labs: No results found for requested labs within last 8760 hours.  Recent Lipid Panel No results found for: CHOL, TRIG, HDL, CHOLHDL, VLDL, LDLCALC, LDLDIRECT  Physical Exam:    VS:  BP 124/62   Pulse 60   Ht 5\' 10"  (1.778 m)   Wt 154 lb 9.6 oz (70.1 kg)   BMI 22.18 kg/m     Wt Readings from Last 3 Encounters:  03/06/20 154 lb 9.6 oz (70.1 kg)  11/16/18 159 lb 6.4 oz (72.3 kg)  11/12/18 150 lb (68 kg)     GEN:  Well nourished, well developed in no acute distress HEENT: Normal NECK: No JVD; No carotid bruits LYMPHATICS: No lymphadenopathy CARDIAC: RRR, no murmurs, rubs, gallops RESPIRATORY:  Clear to auscultation without rales, wheezing or rhonchi  ABDOMEN: Soft, non-tender, non-distended MUSCULOSKELETAL:  No edema; No deformity  SKIN: Warm and dry NEUROLOGIC:  Alert and oriented x 3 PSYCHIATRIC:  Normal affect   ASSESSMENT:    1. Lightheadedness   2. Chest pain of uncertain etiology   3. Near syncope    PLAN:    Dizziness/near syncope: Unclear cause of recent episode of dizziness/near syncope.  Will check Zio patch x2 weeks to rule out arrhythmia.  Will check echocardiogram to rule out structural heart disease.  Chest pain: Atypical chest pain, though does appear to be related to stress.  Given age and lack of risk factors, low risk for obstructive CAD.  EKG is interpretable and he is able to exercise.  Will evaluate with ETT  RTC in 3 months   Medication Adjustments/Labs and Tests Ordered: Current medicines are reviewed at length with the patient today.  Concerns regarding medicines are outlined above.  Orders Placed This Encounter  Procedures  . LONG TERM MONITOR (3-14 DAYS)  . EXERCISE TOLERANCE TEST (ETT)  . EKG 12-Lead  . ECHOCARDIOGRAM COMPLETE   No orders of the defined types were placed in this encounter.   Patient Instructions  Medication Instructions:  Your physician recommends  that you continue on your current medications as directed. Please refer to the Current Medication list given to you today.  Testing/Procedures: Your physician has requested that you have an echocardiogram. Echocardiography is a painless test that uses sound waves to create images of your heart. It provides your doctor with information about the size and shape of your heart and how well your heart's chambers and valves are working. This procedure takes approximately one hour. There are no restrictions for this procedure.  This will be done  at our [employer] location:  [address]  Your physician has requested that you have an exercise tolerance test. For further information please visit [email or web address] Please also follow instruction sheet, as given.  ZIO XT- Long Term Monitor Instructions   Your physician has requested you wear your ZIO patch monitor 14 days.   This is a single patch monitor.  [employer] supplies one patch monitor per enrollment.  Additional stickers are not available.   Please do not apply patch if you will be having a Nuclear Stress Test, Echocardiogram, Cardiac CT, MRI, or Chest Xray during the time frame you would be wearing the monitor. The patch cannot be worn during these tests.  You cannot remove and re-apply the ZIO XT patch monitor.   Your ZIO patch monitor will be sent USPS Priority mail from [facility] directly to your home address. The monitor may also be mailed to a PO BOX if home delivery is not available.   It may take 3-5 days to receive your monitor after you have been enrolled.   Once you have received you monitor, please review enclosed instructions.  Your monitor has already been registered assigning a specific monitor serial # to you.   Applying the monitor   Shave hair from upper left chest.   Hold abrader disc by orange tab.  Rub abrader in 40 strokes over left upper chest as indicated in your monitor instructions.     Clean area with 4 enclosed alcohol pads .  Use all pads to assure are is cleaned thoroughly.  Let dry.   Apply patch as indicated in monitor instructions.  Patch will be place under collarbone on left side of chest with arrow pointing upward.   Rub patch adhesive wings for 2 minutes.Remove white label marked "1".  Remove white label marked "2".  Rub patch adhesive wings for 2 additional minutes.   While looking in a mirror, press and release button in center of patch.  A small green light will flash 3-4 times .  This will be your only indicator the monitor has been turned on.     Do not shower for the first 24 hours.  You may shower after the first 24 hours.   Press button if you feel a symptom. You will hear a small click.  Record Date, Time and Symptom in the Patient Log Book.   When you are ready to remove patch, follow instructions on last 2 pages of Patient Log Book.  Stick patch monitor onto last page of Patient Log Book.   Place Patient Log Book in [place] box.  Use locking tab on box and tape box closed securely.  The [employer] and [employer] has [employer] on it.  Please place in mailbox as soon as possible.  Your physician should have your test results approximately 7 days after the monitor has been mailed back to [facility].   Call [facility] Customer Care at [phone] if you have questions regarding your ZIO XT patch monitor.  Call them immediately if you see an orange light blinking on your monitor.   If your monitor falls off in less than 4 days contact our Monitor department at [phone].  If your monitor becomes loose or falls off after 4 days call [employer] at [phone] for suggestions on securing your monitor.    Follow-Up: At [facility], you and your health needs are our priority.  As part of our continuing mission to provide you with  exceptional heart care, we have created designated Provider Care Teams.  These Care Teams include your primary  Cardiologist (physician) and Advanced Practice Providers (APPs -  Physician Assistants and Nurse Practitioners) who all work together to provide you with the care you need, when you need it.  We recommend signing up for the patient portal called "MyChart".  Sign up information is provided on this After Visit Summary.  MyChart is used to connect with patients for Virtual Visits (Telemedicine).  Patients are able to view lab/test results, encounter notes, upcoming appointments, etc.  Non-urgent messages can be sent to your provider as well.   To learn more about what you can do with MyChart, go to [email or web address].    Your next appointment:   3 month(s)  The format for your next appointment:   In Person  Provider:   Epifanio Lesches, MD       Signed, Little Ishikawa, MD  03/07/2020 4:44 PM    [facility]

## 2020-03-06 ENCOUNTER — Ambulatory Visit: Payer: 59 | Admitting: Cardiology

## 2020-03-06 ENCOUNTER — Other Ambulatory Visit: Payer: Self-pay

## 2020-03-06 ENCOUNTER — Telehealth: Payer: Self-pay | Admitting: Radiology

## 2020-03-06 ENCOUNTER — Encounter: Payer: Self-pay | Admitting: Cardiology

## 2020-03-06 VITALS — BP 124/62 | HR 60 | Ht 70.0 in | Wt 154.6 lb

## 2020-03-06 DIAGNOSIS — R079 Chest pain, unspecified: Secondary | ICD-10-CM

## 2020-03-06 DIAGNOSIS — R42 Dizziness and giddiness: Secondary | ICD-10-CM

## 2020-03-06 DIAGNOSIS — R55 Syncope and collapse: Secondary | ICD-10-CM | POA: Diagnosis not present

## 2020-03-06 NOTE — Patient Instructions (Addendum)
Medication Instructions:  Your physician recommends that you continue on your current medications as directed. Please refer to the Current Medication list given to you today.  Testing/Procedures: Your physician has requested that you have an echocardiogram. Echocardiography is a painless test that uses sound waves to create images of your heart. It provides your doctor with information about the size and shape of your heart and how well your heart's chambers and valves are working. This procedure takes approximately one hour. There are no restrictions for this procedure.  This will be done at our [facility] location:  [address]  Your physician has requested that you have an exercise tolerance test. For further information please visit [email or web address] Please also follow instruction sheet, as given.  ZIO XT- Long Term Monitor Instructions   Your physician has requested you wear your ZIO patch monitor 14 days.   This is a single patch monitor.  [employer] supplies one patch monitor per enrollment.  Additional stickers are not available.   Please do not apply patch if you will be having a Nuclear Stress Test, Echocardiogram, Cardiac CT, MRI, or Chest Xray during the time frame you would be wearing the monitor. The patch cannot be worn during these tests.  You cannot remove and re-apply the ZIO XT patch monitor.   Your ZIO patch monitor will be sent USPS Priority mail from [facility] directly to your home address. The monitor may also be mailed to a PO BOX if home delivery is not available.   It may take 3-5 days to receive your monitor after you have been enrolled.   Once you have received you monitor, please review enclosed instructions.  Your monitor has already been registered assigning a specific monitor serial # to you.   Applying the monitor   Shave hair from upper left chest.   Hold abrader disc by orange tab.  Rub abrader in 40 strokes over left upper  chest as indicated in your monitor instructions.   Clean area with 4 enclosed alcohol pads .  Use all pads to assure are is cleaned thoroughly.  Let dry.   Apply patch as indicated in monitor instructions.  Patch will be place under collarbone on left side of chest with arrow pointing upward.   Rub patch adhesive wings for 2 minutes.Remove white label marked "1".  Remove white label marked "2".  Rub patch adhesive wings for 2 additional minutes.   While looking in a mirror, press and release button in center of patch.  A small green light will flash 3-4 times .  This will be your only indicator the monitor has been turned on.     Do not shower for the first 24 hours.  You may shower after the first 24 hours.   Press button if you feel a symptom. You will hear a small click.  Record Date, Time and Symptom in the Patient Log Book.   When you are ready to remove patch, follow instructions on last 2 pages of Patient Log Book.  Stick patch monitor onto last page of Patient Log Book.   Place Patient Log Book in [place] box.  Use locking tab on box and tape box closed securely.  The [employer] has [employer] on it.  Please place in mailbox as soon as possible.  Your physician should have your test results approximately 7 days after the monitor has been mailed back to [facility].   Call [employer]  [facility] at [phone] if you have questions regarding your ZIO XT patch monitor.  Call them immediately if you see an orange light blinking on your monitor.   If your monitor falls off in less than 4 days contact our Monitor department at [phone].  If your monitor becomes loose or falls off after 4 days call [employer] at [phone] for suggestions on securing your monitor.    Follow-Up: At [facility], you and your health needs are our priority.  As part of our continuing mission to provide you with exceptional heart care, we have created designated Provider Care  Teams.  These Care Teams include your primary Cardiologist (physician) and Advanced Practice Providers (APPs -  Physician Assistants and Nurse Practitioners) who all work together to provide you with the care you need, when you need it.  We recommend signing up for the patient portal called "MyChart".  Sign up information is provided on this After Visit Summary.  MyChart is used to connect with patients for Virtual Visits (Telemedicine).  Patients are able to view lab/test results, encounter notes, upcoming appointments, etc.  Non-urgent messages can be sent to your provider as well.   To learn more about what you can do with MyChart, go to [email or web address].    Your next appointment:   3 month(s)  The format for your next appointment:   In Person  Provider:   Epifanio Lesches, MD

## 2020-03-06 NOTE — Telephone Encounter (Signed)
Enrolled patient for a 14 day Zio monitor to be mailed to patients home.  

## 2020-03-14 ENCOUNTER — Telehealth (HOSPITAL_COMMUNITY): Payer: Self-pay

## 2020-03-14 NOTE — Telephone Encounter (Signed)
Encounter complete. 

## 2020-03-16 ENCOUNTER — Other Ambulatory Visit (INDEPENDENT_AMBULATORY_CARE_PROVIDER_SITE_OTHER): Payer: 59

## 2020-03-16 DIAGNOSIS — R42 Dizziness and giddiness: Secondary | ICD-10-CM | POA: Diagnosis not present

## 2020-03-16 DIAGNOSIS — R55 Syncope and collapse: Secondary | ICD-10-CM

## 2020-03-19 ENCOUNTER — Other Ambulatory Visit: Payer: Self-pay

## 2020-03-19 ENCOUNTER — Ambulatory Visit (HOSPITAL_COMMUNITY)
Admission: RE | Admit: 2020-03-19 | Discharge: 2020-03-19 | Disposition: A | Discharge status: Home / Self Care | Payer: 59 | Source: Ambulatory Visit | Attending: Cardiovascular Disease | Admitting: Cardiovascular Disease

## 2020-03-19 DIAGNOSIS — R079 Chest pain, unspecified: Secondary | ICD-10-CM | POA: Diagnosis not present

## 2020-03-19 LAB — EXERCISE TOLERANCE TEST
Estimated workload: 17.1 METS
Exercise duration (min): 14 min
Exercise duration (sec): 2 s
MPHR: 189 {beats}/min
Peak HR: 190 {beats}/min
Percent HR: 100 %
Rest HR: 45 {beats}/min

## 2020-03-31 ENCOUNTER — Other Ambulatory Visit (HOSPITAL_COMMUNITY): Payer: 59

## 2020-04-04 ENCOUNTER — Other Ambulatory Visit: Payer: Self-pay

## 2020-04-04 ENCOUNTER — Ambulatory Visit (HOSPITAL_COMMUNITY): Payer: 59 | Attending: Cardiology

## 2020-04-04 DIAGNOSIS — R42 Dizziness and giddiness: Secondary | ICD-10-CM

## 2020-04-04 DIAGNOSIS — R55 Syncope and collapse: Secondary | ICD-10-CM | POA: Diagnosis not present

## 2020-04-04 LAB — ECHOCARDIOGRAM COMPLETE
Area-P 1/2: 4.15 cm2
S' Lateral: 3 cm

## 2020-05-14 ENCOUNTER — Telehealth: Payer: Self-pay | Admitting: Cardiology

## 2020-05-14 NOTE — Telephone Encounter (Signed)
Patient would like a Nurse to go over his Monitor results over the phone rather than coming in for an appointment. Please call to discuss results

## 2020-05-14 NOTE — Telephone Encounter (Signed)
Discussed results with patient, he states he is doing well and no further issues or concerns at this time.   F/u cancelled and patient will call if needed.

## 2020-06-03 ENCOUNTER — Ambulatory Visit: Payer: 59 | Admitting: Cardiology
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
## 2020-12-04 IMAGING — DX DG CHEST 2 VIEW
2 series · 2 of 2 positions shown · non-contrast
Comparison: None.

CLINICAL DATA: Pain after fall.

EXAM:
CHEST - 2 VIEW

[chest pa]
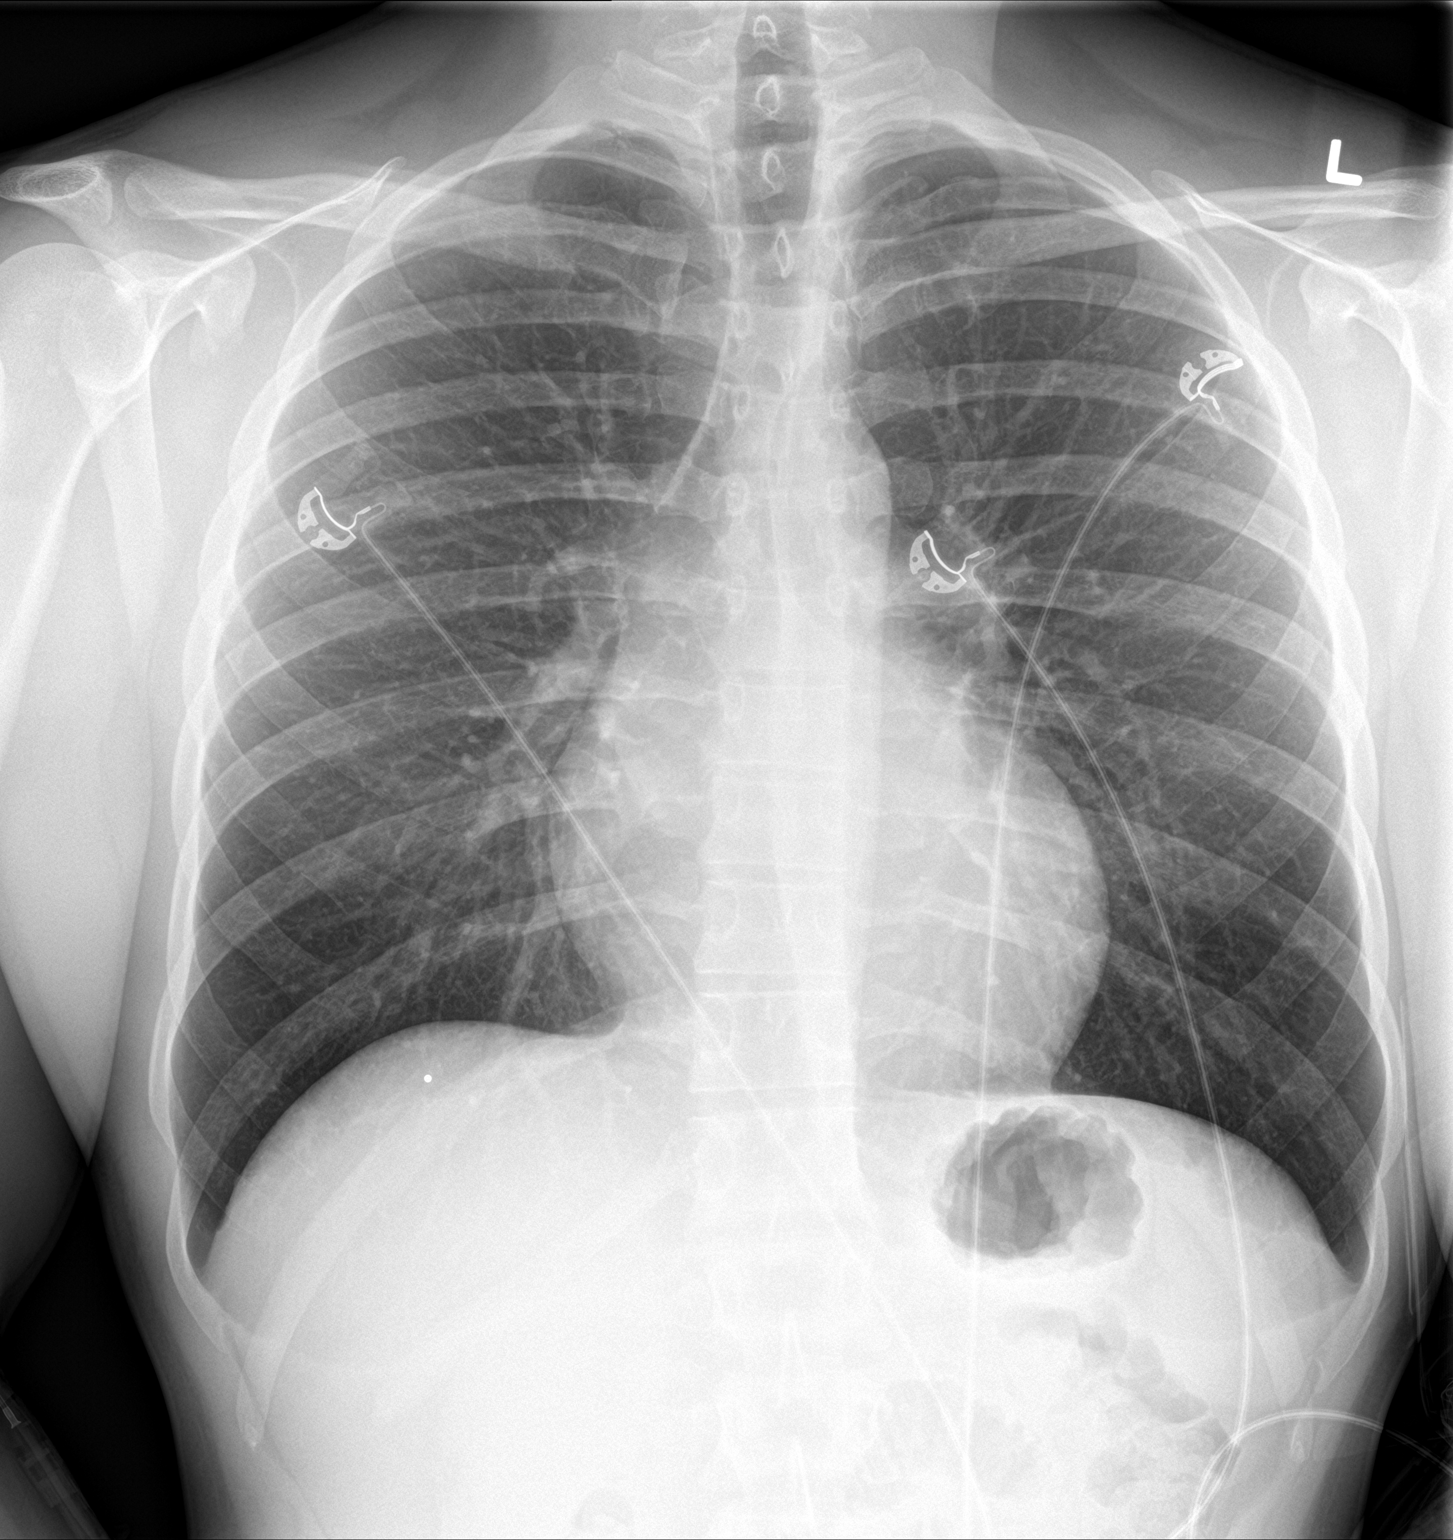

[chest lat]
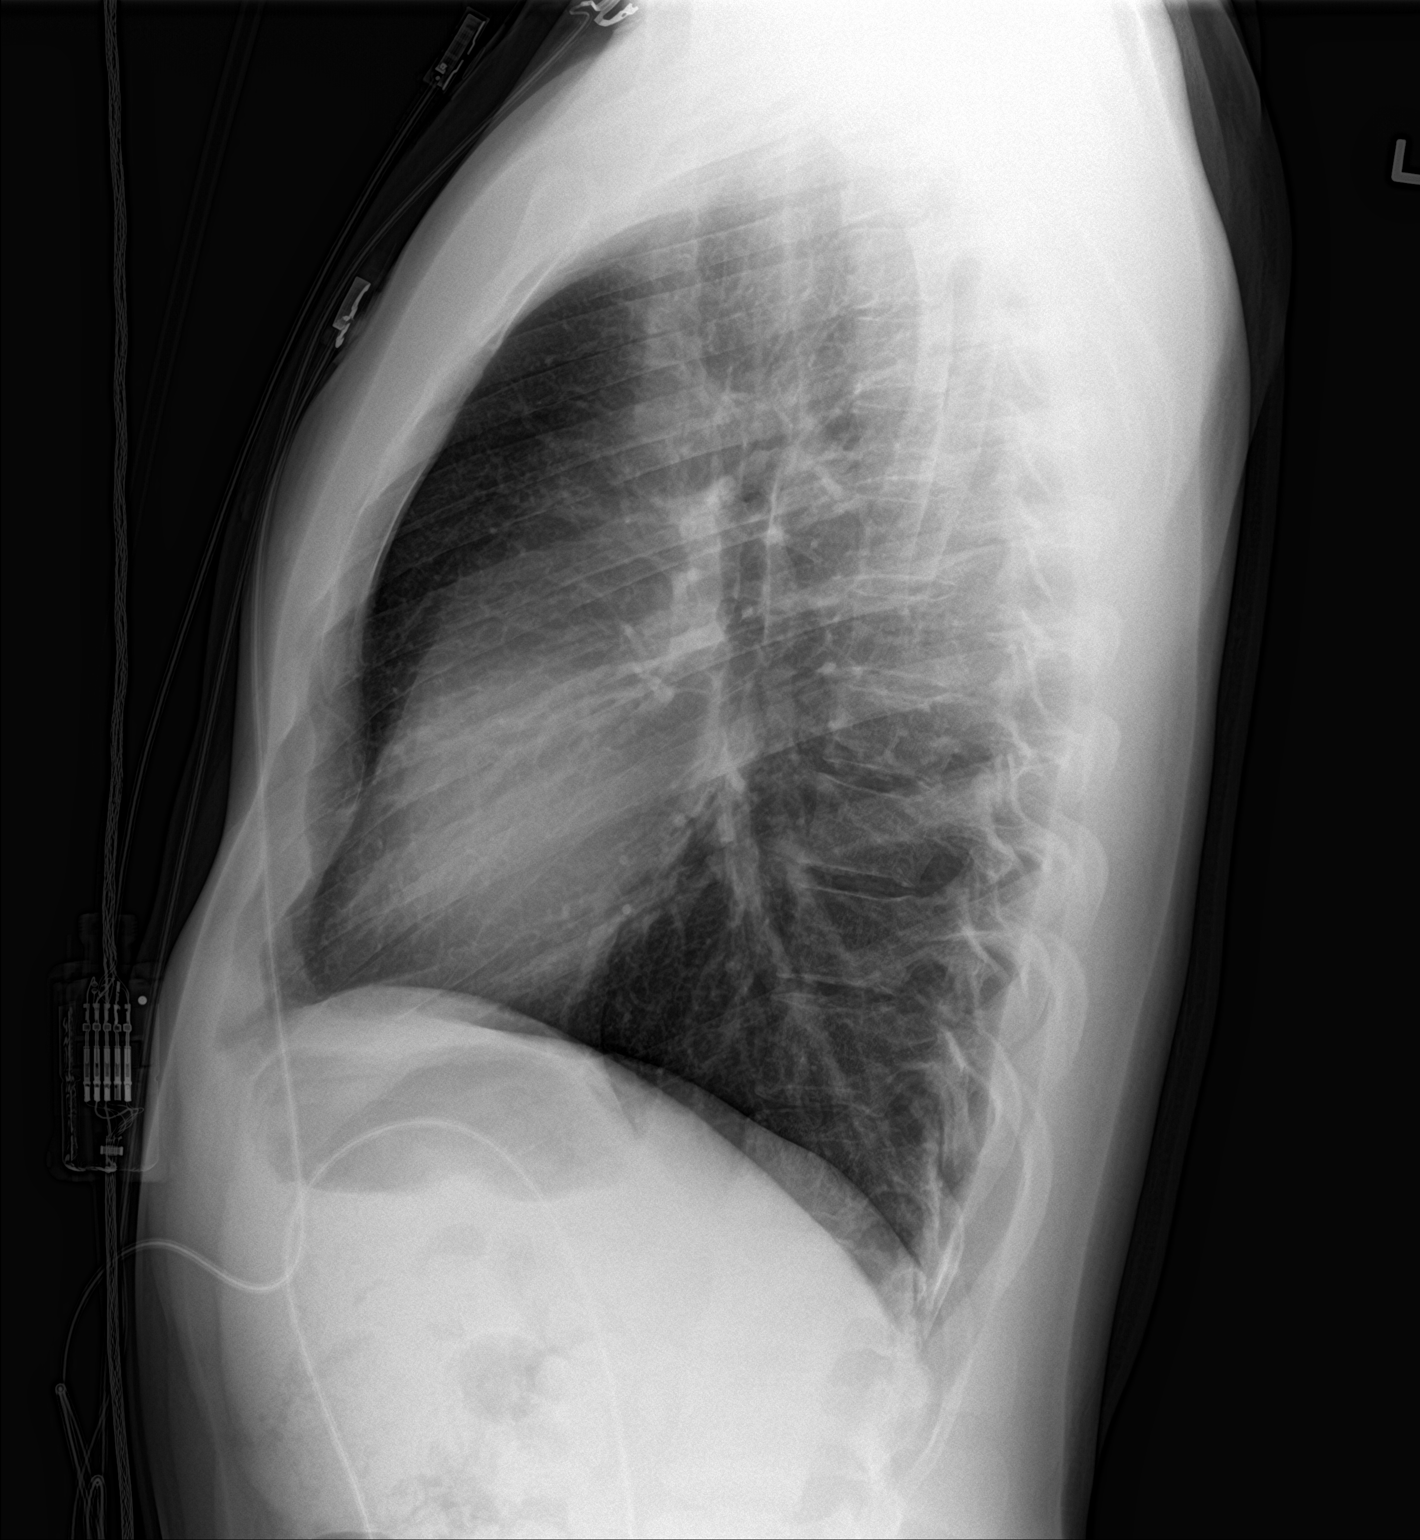

[2 of 2 positions shown; findings below may reference images not displayed]

FINDINGS: The heart size and mediastinal contours are within normal limits.
Both lungs are clear. The visualized skeletal structures are
unremarkable.
IMPRESSION: No active cardiopulmonary disease.

## 2023-11-01 ENCOUNTER — Emergency Department (HOSPITAL_COMMUNITY)

## 2023-11-01 ENCOUNTER — Inpatient Hospital Stay (HOSPITAL_COMMUNITY)

## 2023-11-01 ENCOUNTER — Encounter (HOSPITAL_COMMUNITY): Payer: Self-pay

## 2023-11-01 ENCOUNTER — Other Ambulatory Visit: Payer: Self-pay

## 2023-11-01 ENCOUNTER — Observation Stay (HOSPITAL_COMMUNITY)
Admission: EM | Admit: 2023-11-01 | Discharge: 2023-11-02 | Disposition: A | Discharge status: Home / Self Care | Attending: Emergency Medicine | Admitting: Emergency Medicine

## 2023-11-01 DIAGNOSIS — S63105A Unspecified dislocation of left thumb, initial encounter: Principal | ICD-10-CM | POA: Insufficient documentation

## 2023-11-01 DIAGNOSIS — T07XXXA Unspecified multiple injuries, initial encounter: Secondary | ICD-10-CM | POA: Diagnosis not present

## 2023-11-01 DIAGNOSIS — Z23 Encounter for immunization: Secondary | ICD-10-CM | POA: Insufficient documentation

## 2023-11-01 DIAGNOSIS — S60932A Unspecified superficial injury of left thumb, initial encounter: Secondary | ICD-10-CM | POA: Diagnosis present

## 2023-11-01 DIAGNOSIS — R519 Headache, unspecified: Secondary | ICD-10-CM | POA: Insufficient documentation

## 2023-11-01 DIAGNOSIS — M545 Low back pain, unspecified: Secondary | ICD-10-CM | POA: Diagnosis not present

## 2023-11-01 LAB — COMPREHENSIVE METABOLIC PANEL
ALT: 23 U/L (ref 0–44)
AST: 29 U/L (ref 15–41)
Albumin: 4.2 g/dL (ref 3.5–5.0)
Alkaline Phosphatase: 45 U/L (ref 38–126)
Anion gap: 12 (ref 5–15)
BUN: 20 mg/dL (ref 6–20)
CO2: 21 mmol/L — ABNORMAL LOW (ref 22–32)
Calcium: 9.3 mg/dL (ref 8.9–10.3)
Chloride: 105 mmol/L (ref 98–111)
Creatinine, Ser: 0.99 mg/dL (ref 0.61–1.24)
GFR, Estimated: >60 mL/min (ref >60)
Glucose, Bld: 98 mg/dL (ref 70–99)
Potassium: 3.6 mmol/L (ref 3.5–5.1)
Sodium: 138 mmol/L (ref 135–145)
Total Bilirubin: 1.1 mg/dL (ref 0.0–1.2)
Total Protein: 7.2 g/dL (ref 6.5–8.1)

## 2023-11-01 LAB — URINALYSIS, ROUTINE W REFLEX MICROSCOPIC
Bilirubin Urine: NEGATIVE
Glucose, UA: NEGATIVE mg/dL
Hgb urine dipstick: NEGATIVE
Ketones, ur: 5 mg/dL — AB
Leukocytes,Ua: NEGATIVE
Nitrite: NEGATIVE
Protein, ur: NEGATIVE mg/dL
Specific Gravity, Urine: 1.033 — ABNORMAL HIGH (ref 1.005–1.030)
pH: 5 (ref 5.0–8.0)

## 2023-11-01 LAB — CBC
HCT: 43.3 % (ref 39.0–52.0)
HCT: 42.9 % (ref 39.0–52.0)
Hemoglobin: 14.7 g/dL (ref 13.0–17.0)
Hemoglobin: 14.5 g/dL (ref 13.0–17.0)
MCH: 29.3 pg (ref 26.0–34.0)
MCH: 29.2 pg (ref 26.0–34.0)
MCHC: 33.9 g/dL (ref 30.0–36.0)
MCHC: 33.8 g/dL (ref 30.0–36.0)
MCV: 86.3 fL (ref 80.0–100.0)
MCV: 86.3 fL (ref 80.0–100.0)
Platelets: 165 10*3/uL (ref 150–400)
Platelets: 169 10*3/uL (ref 150–400)
RBC: 5.02 MIL/uL (ref 4.22–5.81)
RBC: 4.97 MIL/uL (ref 4.22–5.81)
RDW: 12.1 % (ref 11.5–15.5)
RDW: 12.1 % (ref 11.5–15.5)
WBC: 6.7 10*3/uL (ref 4.0–10.5)
WBC: 11.3 10*3/uL — ABNORMAL HIGH (ref 4.0–10.5)
nRBC: 0 % (ref 0.0–0.2)
nRBC: 0 % (ref 0.0–0.2)

## 2023-11-01 LAB — I-STAT CHEM 8, ED
BUN: 22 mg/dL — ABNORMAL HIGH (ref 6–20)
Calcium, Ion: 1.06 mmol/L — ABNORMAL LOW (ref 1.15–1.40)
Chloride: 105 mmol/L (ref 98–111)
Creatinine, Ser: 1.1 mg/dL (ref 0.61–1.24)
Glucose, Bld: 96 mg/dL (ref 70–99)
HCT: 42 % (ref 39.0–52.0)
Hemoglobin: 14.3 g/dL (ref 13.0–17.0)
Potassium: 3.6 mmol/L (ref 3.5–5.1)
Sodium: 138 mmol/L (ref 135–145)
TCO2: 22 mmol/L (ref 22–32)

## 2023-11-01 LAB — PROTIME-INR
INR: 1.1 (ref 0.8–1.2)
Prothrombin Time: 14 s (ref 11.4–15.2)

## 2023-11-01 LAB — CREATININE, SERUM
Creatinine, Ser: 1.05 mg/dL (ref 0.61–1.24)
GFR, Estimated: >60 mL/min (ref >60)

## 2023-11-01 LAB — SAMPLE TO BLOOD BANK

## 2023-11-01 LAB — ETHANOL: Alcohol, Ethyl (B): <10 mg/dL (ref <10)

## 2023-11-01 LAB — I-STAT CG4 LACTIC ACID, ED: Lactic Acid, Venous: 1.5 mmol/L (ref 0.5–1.9)

## 2023-11-01 LAB — HIV ANTIBODY (ROUTINE TESTING W REFLEX): HIV Screen 4th Generation wRfx: NONREACTIVE

## 2023-11-01 MED ORDER — POLYETHYLENE GLYCOL 3350 17 G PO PACK: 17.0000 g | PACK | Freq: Every day | ORAL | Status: DC | PRN

## 2023-11-01 MED ORDER — ENOXAPARIN SODIUM 30 MG/0.3ML IJ SOSY
30.0000 mg | PREFILLED_SYRINGE | Freq: Two times a day (BID) | INTRAMUSCULAR | Status: DC
  Administered 2023-11-02: 30 mg via SUBCUTANEOUS
  Filled 2023-11-01: qty 0.3

## 2023-11-01 MED ORDER — OXYCODONE HCL 5 MG PO TABS
5.0000 mg | ORAL_TABLET | ORAL | Status: DC | PRN
  Administered 2023-11-01 – 2023-11-02 (×3): 5 mg via ORAL
  Filled 2023-11-01 (×3): qty 1

## 2023-11-01 MED ORDER — ONDANSETRON 4 MG PO TBDP: 4.0000 mg | ORAL_TABLET | Freq: Four times a day (QID) | ORAL | Status: DC | PRN

## 2023-11-01 MED ORDER — BACITRACIN ZINC 500 UNIT/GM EX OINT
TOPICAL_OINTMENT | Freq: Two times a day (BID) | CUTANEOUS | Status: DC
  Administered 2023-11-01: 31.5 via TOPICAL
  Administered 2023-11-01: 1 via TOPICAL
  Administered 2023-11-02: 31.5 via TOPICAL
  Filled 2023-11-01: qty 0.9
  Filled 2023-11-01 (×2): qty 28.35

## 2023-11-01 MED ORDER — METHOCARBAMOL 500 MG PO TABS
500.0000 mg | ORAL_TABLET | ORAL | Status: DC
  Administered 2023-11-01 – 2023-11-02 (×2): 500 mg via ORAL
  Filled 2023-11-01 (×2): qty 1

## 2023-11-01 MED ORDER — METOPROLOL TARTRATE 5 MG/5ML IV SOLN: 5.0000 mg | Freq: Four times a day (QID) | INTRAVENOUS | Status: DC | PRN

## 2023-11-01 MED ORDER — GABAPENTIN 100 MG PO CAPS
100.0000 mg | ORAL_CAPSULE | Freq: Three times a day (TID) | ORAL | Status: DC
  Administered 2023-11-01 – 2023-11-02 (×3): 100 mg via ORAL
  Filled 2023-11-01 (×3): qty 1

## 2023-11-01 MED ORDER — OXYCODONE HCL 5 MG PO TABS: 10.0000 mg | ORAL_TABLET | ORAL | Status: DC | PRN

## 2023-11-01 MED ORDER — METHOCARBAMOL 1000 MG/10ML IJ SOLN: 500.0000 mg | INTRAMUSCULAR | Status: DC

## 2023-11-01 MED ORDER — HYDROMORPHONE HCL 1 MG/ML IJ SOLN: 0.5000 mg | INTRAMUSCULAR | Status: DC | PRN

## 2023-11-01 MED ORDER — LACTATED RINGERS IV SOLN: INTRAVENOUS | Status: AC

## 2023-11-01 MED ORDER — TETANUS-DIPHTH-ACELL PERTUSSIS 5-2.5-18.5 LF-MCG/0.5 IM SUSY
0.5000 mL | PREFILLED_SYRINGE | Freq: Once | INTRAMUSCULAR | Status: AC
  Administered 2023-11-01: 0.5 mL via INTRAMUSCULAR

## 2023-11-01 MED ORDER — FENTANYL CITRATE PF 50 MCG/ML IJ SOSY
50.0000 ug | PREFILLED_SYRINGE | Freq: Once | INTRAMUSCULAR | Status: AC
  Administered 2023-11-01: 50 ug via INTRAVENOUS
  Filled 2023-11-01: qty 1

## 2023-11-01 MED ORDER — FENTANYL CITRATE PF 50 MCG/ML IJ SOSY
PREFILLED_SYRINGE | INTRAMUSCULAR | Status: AC
  Administered 2023-11-01: 50 ug
  Filled 2023-11-01: qty 2

## 2023-11-01 MED ORDER — LIDOCAINE HCL 2 % IJ SOLN
INTRAMUSCULAR | Status: AC
  Administered 2023-11-01: 400 mg
  Filled 2023-11-01: qty 20

## 2023-11-01 MED ORDER — ACETAMINOPHEN 500 MG PO TABS
1000.0000 mg | ORAL_TABLET | Freq: Four times a day (QID) | ORAL | Status: DC
  Administered 2023-11-01 – 2023-11-02 (×4): 1000 mg via ORAL
  Filled 2023-11-01 (×4): qty 2

## 2023-11-01 MED ORDER — HYDRALAZINE HCL 20 MG/ML IJ SOLN: 10.0000 mg | INTRAMUSCULAR | Status: DC | PRN

## 2023-11-01 MED ORDER — FENTANYL CITRATE PF 50 MCG/ML IJ SOSY
PREFILLED_SYRINGE | INTRAMUSCULAR | Status: AC
Start: 2023-11-01 — End: 2023-11-01
  Administered 2023-11-01: 50 ug via INTRAVENOUS
  Filled 2023-11-01: qty 1

## 2023-11-01 MED ORDER — CEFAZOLIN SODIUM-DEXTROSE 2-4 GM/100ML-% IV SOLN
2.0000 g | Freq: Once | INTRAVENOUS | Status: AC
  Administered 2023-11-01: 2 g via INTRAVENOUS
  Filled 2023-11-01: qty 100

## 2023-11-01 MED ORDER — IOHEXOL 350 MG/ML SOLN
75.0000 mL | Freq: Once | INTRAVENOUS | Status: AC | PRN
  Administered 2023-11-01: 75 mL via INTRAVENOUS

## 2023-11-01 MED ORDER — ONDANSETRON HCL 4 MG/2ML IJ SOLN: 4.0000 mg | Freq: Four times a day (QID) | INTRAMUSCULAR | Status: DC | PRN

## 2023-11-01 MED ORDER — DOCUSATE SODIUM 100 MG PO CAPS
100.0000 mg | ORAL_CAPSULE | Freq: Two times a day (BID) | ORAL | Status: DC
  Administered 2023-11-01 – 2023-11-02 (×3): 100 mg via ORAL
  Filled 2023-11-01 (×3): qty 1

## 2023-11-01 NOTE — ED Notes (Signed)
 Bailey Mech, PA with trauma surgery at bedside suturing perineum, scrotum and L shin.

## 2023-11-01 NOTE — Progress Notes (Signed)
 Ortho tech notified of need for wrist brace with L thumb spica.
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
## 2023-11-01 NOTE — Progress Notes (Signed)
 Transition of Care [facility]) - CAGE-AID Screening   Patient Details  Name: Brian Blake MRN: [id number] Date of Birth: 18-Apr-1986  [facility] of [facility]) CM/SW Contact:    Katha Hamming, RN Phone Number: 11/01/2023, 7:35 PM   Clinical Narrative:  Rare alcohol consumption, no resources indicated.  CAGE-AID Screening:    Have You Ever Felt You Ought to Cut Down on Your Drinking or Drug Use?: No Have People Annoyed You By Critizing Your Drinking Or Drug Use?: No Have You Felt Bad Or Guilty About Your Drinking Or Drug Use?: No Have You Ever Had a Drink or Used Drugs First Thing In The Morning to Steady Your Nerves or to Get Rid of a Hangover?: No CAGE-AID Score: 0  Substance Abuse Education Offered: No

## 2023-11-01 NOTE — Plan of Care (Signed)
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
## 2023-11-01 NOTE — Progress Notes (Signed)
 Orthopedic Tech Progress Note Patient Details:  Brian Blake 1986-07-15 [id number]  Ortho Devices Type of Ortho Device: Thumb velcro splint Ortho Device/Splint Location: LUE Ortho Device/Splint Interventions: Ordered, Application, Adjustment   Post Interventions Patient Tolerated: Fair Instructions Provided: Care of device  [name] 11/01/2023, 6:29 PM
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
## 2023-11-02 ENCOUNTER — Inpatient Hospital Stay (HOSPITAL_COMMUNITY)

## 2023-11-02 LAB — CBC
HCT: 38.9 % — ABNORMAL LOW (ref 39.0–52.0)
Hemoglobin: 13.3 g/dL (ref 13.0–17.0)
MCH: 29.5 pg (ref 26.0–34.0)
MCHC: 34.2 g/dL (ref 30.0–36.0)
MCV: 86.3 fL (ref 80.0–100.0)
Platelets: 144 10*3/uL — ABNORMAL LOW (ref 150–400)
RBC: 4.51 MIL/uL (ref 4.22–5.81)
RDW: 12.2 % (ref 11.5–15.5)
WBC: 6.1 10*3/uL (ref 4.0–10.5)
nRBC: 0 % (ref 0.0–0.2)

## 2023-11-02 LAB — BASIC METABOLIC PANEL
Anion gap: 3 — ABNORMAL LOW (ref 5–15)
BUN: 12 mg/dL (ref 6–20)
CO2: 26 mmol/L (ref 22–32)
Calcium: 8.2 mg/dL — ABNORMAL LOW (ref 8.9–10.3)
Chloride: 106 mmol/L (ref 98–111)
Creatinine, Ser: 0.94 mg/dL (ref 0.61–1.24)
GFR, Estimated: >60 mL/min (ref >60)
Glucose, Bld: 104 mg/dL — ABNORMAL HIGH (ref 70–99)
Potassium: 4 mmol/L (ref 3.5–5.1)
Sodium: 135 mmol/L (ref 135–145)

## 2023-11-02 MED ORDER — DOCUSATE SODIUM 100 MG PO CAPS: 100.0000 mg | ORAL_CAPSULE | Freq: Two times a day (BID) | ORAL | Status: AC | PRN

## 2023-11-02 MED ORDER — METHOCARBAMOL 500 MG PO TABS: 500.0000 mg | ORAL_TABLET | Freq: Four times a day (QID) | ORAL | 0 refills | Status: AC | PRN

## 2023-11-02 MED ORDER — ACETAMINOPHEN 500 MG PO TABS: 1000.0000 mg | ORAL_TABLET | Freq: Four times a day (QID) | ORAL | Status: AC | PRN

## 2023-11-02 MED ORDER — POLYETHYLENE GLYCOL 3350 17 G PO PACK: 17.0000 g | PACK | Freq: Every day | ORAL | Status: AC | PRN

## 2023-11-02 MED ORDER — GABAPENTIN 100 MG PO CAPS: 100.0000 mg | ORAL_CAPSULE | Freq: Three times a day (TID) | ORAL | 0 refills | Status: AC | PRN

## 2023-11-02 MED ORDER — OXYCODONE HCL 5 MG PO TABS: 5.0000 mg | ORAL_TABLET | Freq: Four times a day (QID) | ORAL | 0 refills | Status: AC | PRN

## 2023-11-02 MED ORDER — BACITRACIN ZINC 500 UNIT/GM EX OINT: TOPICAL_OINTMENT | Freq: Two times a day (BID) | CUTANEOUS | 0 refills | Status: AC
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
## 2023-11-02 NOTE — Progress Notes (Signed)
 DISCHARGE NOTE HOME Brian Blake to be discharged Home per MD order. Discussed prescriptions and follow up appointments with the patient. Prescriptions given to patient; medication list explained in detail. Patient verbalized understanding.  Skin clean, dry and intact without evidence of skin break down, no evidence of skin tears noted. IV catheter discontinued intact. Site without signs and symptoms of complications. Dressing and pressure applied. Pt denies pain at the site currently. No complaints noted.  Patient free of lines, drains, and wound other than noted on LDA bilateral knee scrotum and perineum  An After Visit Summary (AVS) was printed and given to the patient. Patient insisted on walking, and  was discharged home via private auto.  [name], RN

## 2023-11-03 ENCOUNTER — Other Ambulatory Visit: Payer: Self-pay

## 2023-11-03 ENCOUNTER — Encounter (HOSPITAL_COMMUNITY): Payer: Self-pay

## 2023-11-03 ENCOUNTER — Emergency Department (HOSPITAL_COMMUNITY)

## 2023-11-03 ENCOUNTER — Emergency Department (HOSPITAL_COMMUNITY)
Admission: EM | Admit: 2023-11-03 | Discharge: 2023-11-03 | Disposition: A | Discharge status: Home / Self Care | Attending: Emergency Medicine | Admitting: Emergency Medicine

## 2023-11-03 DIAGNOSIS — R55 Syncope and collapse: Secondary | ICD-10-CM | POA: Diagnosis present

## 2023-11-03 LAB — CBC
HCT: 42.5 % (ref 39.0–52.0)
Hemoglobin: 14 g/dL (ref 13.0–17.0)
MCH: 29.5 pg (ref 26.0–34.0)
MCHC: 32.9 g/dL (ref 30.0–36.0)
MCV: 89.7 fL (ref 80.0–100.0)
Platelets: 167 10*3/uL (ref 150–400)
RBC: 4.74 MIL/uL (ref 4.22–5.81)
RDW: 12.4 % (ref 11.5–15.5)
WBC: 9.5 10*3/uL (ref 4.0–10.5)
nRBC: 0 % (ref 0.0–0.2)

## 2023-11-03 LAB — URINALYSIS, ROUTINE W REFLEX MICROSCOPIC
Bacteria, UA: NONE SEEN
Bilirubin Urine: NEGATIVE
Glucose, UA: NEGATIVE mg/dL
Hgb urine dipstick: NEGATIVE
Ketones, ur: 5 mg/dL — AB
Leukocytes,Ua: NEGATIVE
Nitrite: NEGATIVE
Protein, ur: 30 mg/dL — AB
Specific Gravity, Urine: 1.028 (ref 1.005–1.030)
pH: 6 (ref 5.0–8.0)

## 2023-11-03 LAB — BASIC METABOLIC PANEL
Anion gap: 7 (ref 5–15)
BUN: 14 mg/dL (ref 6–20)
CO2: 27 mmol/L (ref 22–32)
Calcium: 9.1 mg/dL (ref 8.9–10.3)
Chloride: 103 mmol/L (ref 98–111)
Creatinine, Ser: 1.13 mg/dL (ref 0.61–1.24)
GFR, Estimated: >60 mL/min (ref >60)
Glucose, Bld: 144 mg/dL — ABNORMAL HIGH (ref 70–99)
Potassium: 4.4 mmol/L (ref 3.5–5.1)
Sodium: 137 mmol/L (ref 135–145)

## 2023-11-03 LAB — CBG MONITORING, ED: Glucose-Capillary: 123 mg/dL — ABNORMAL HIGH (ref 70–99)

## 2023-11-03 MED ORDER — MORPHINE SULFATE (PF) 4 MG/ML IV SOLN
4.0000 mg | Freq: Once | INTRAVENOUS | Status: DC
  Filled 2023-11-03: qty 1

## 2023-11-03 MED ORDER — LACTATED RINGERS IV BOLUS
1000.0000 mL | Freq: Once | INTRAVENOUS | Status: AC
  Administered 2023-11-03: 1000 mL via INTRAVENOUS

## 2023-11-03 NOTE — Discharge Instructions (Signed)
 Your EKG, blood labs, and CT of your head came back reassuring.  We gave you fluids for component of dehydration.  Be sure to slowly sip fluids throughout the day and target a urine color of pale yellow.  Also take it very easy with the activity, especially when sitting up and standing.  Follow-up with your primary doctor early next week to ensure you are still continuing to improve or return if symptoms worsen before then.
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
## 2023-11-03 NOTE — ED Notes (Signed)
 Ambulatory to lobby at time of discharge. No additional instructions for RN

## 2023-11-03 NOTE — ED Notes (Signed)
 Patient transported to CT

## 2023-11-03 NOTE — ED Notes (Signed)
 MD at bedside.

## 2023-11-03 NOTE — ED Notes (Signed)
 Patient ambulated to restroom with steady gait.
